# Patient Record
Sex: Female | Born: 1990 | Race: Black or African American | Hispanic: No | Marital: Married | Smoking: Never smoker
Health system: Southern US, Community
[De-identification: ages and names within clinical notes are randomized; demographics above are authoritative.]

## PROBLEM LIST (undated history)

## (undated) DIAGNOSIS — E119 Type 2 diabetes mellitus without complications: Secondary | ICD-10-CM

## (undated) DIAGNOSIS — J45909 Unspecified asthma, uncomplicated: Secondary | ICD-10-CM

## (undated) DIAGNOSIS — I10 Essential (primary) hypertension: Secondary | ICD-10-CM

## (undated) DIAGNOSIS — D649 Anemia, unspecified: Secondary | ICD-10-CM

---

## 2019-07-13 ENCOUNTER — Emergency Department (HOSPITAL_COMMUNITY): Payer: No Typology Code available for payment source

## 2019-07-13 ENCOUNTER — Emergency Department (HOSPITAL_COMMUNITY)
Admission: EM | Admit: 2019-07-13 | Discharge: 2019-07-13 | Disposition: A | Discharge status: Home / Self Care | Payer: No Typology Code available for payment source | Attending: Emergency Medicine | Admitting: Emergency Medicine

## 2019-07-13 ENCOUNTER — Other Ambulatory Visit: Payer: Self-pay

## 2019-07-13 DIAGNOSIS — R519 Headache, unspecified: Secondary | ICD-10-CM | POA: Diagnosis not present

## 2019-07-13 DIAGNOSIS — M545 Low back pain, unspecified: Secondary | ICD-10-CM

## 2019-07-13 DIAGNOSIS — G8921 Chronic pain due to trauma: Secondary | ICD-10-CM | POA: Insufficient documentation

## 2019-07-13 MED ORDER — DICLOFENAC SODIUM 75 MG PO TBEC: 75.0000 mg | DELAYED_RELEASE_TABLET | Freq: Two times a day (BID) | ORAL | 0 refills | Status: DC

## 2019-07-13 MED ORDER — METHOCARBAMOL 500 MG PO TABS: 500.0000 mg | ORAL_TABLET | Freq: Two times a day (BID) | ORAL | 0 refills | Status: DC

## 2019-07-13 MED ORDER — IBUPROFEN 800 MG PO TABS
800.0000 mg | ORAL_TABLET | Freq: Once | ORAL | Status: AC
  Administered 2019-07-13: 19:00:00 800 mg via ORAL
  Filled 2019-07-13: qty 1
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
## 2019-07-13 NOTE — ED Notes (Signed)
Patient verbalizes understanding of discharge instructions. Opportunity for questioning and answers were provided. Armband removed by staff, pt discharged from ED ambulatory.   
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
## 2019-07-13 NOTE — Discharge Instructions (Signed)
Return if any problems. See your Physician for recheck if pain persist  

## 2019-09-01 ENCOUNTER — Emergency Department (HOSPITAL_COMMUNITY)
Admission: EM | Admit: 2019-09-01 | Discharge: 2019-09-02 | Disposition: A | Discharge status: Home / Self Care | Payer: Medicaid Other | Attending: Emergency Medicine | Admitting: Emergency Medicine

## 2019-09-01 DIAGNOSIS — B9689 Other specified bacterial agents as the cause of diseases classified elsewhere: Secondary | ICD-10-CM | POA: Insufficient documentation

## 2019-09-01 DIAGNOSIS — R1030 Lower abdominal pain, unspecified: Secondary | ICD-10-CM

## 2019-09-01 DIAGNOSIS — N760 Acute vaginitis: Secondary | ICD-10-CM | POA: Diagnosis not present

## 2019-09-01 DIAGNOSIS — R10815 Periumbilic abdominal tenderness: Secondary | ICD-10-CM | POA: Diagnosis not present

## 2019-09-01 DIAGNOSIS — Z8709 Personal history of other diseases of the respiratory system: Secondary | ICD-10-CM | POA: Insufficient documentation

## 2019-09-01 DIAGNOSIS — M549 Dorsalgia, unspecified: Secondary | ICD-10-CM

## 2019-09-01 DIAGNOSIS — E119 Type 2 diabetes mellitus without complications: Secondary | ICD-10-CM | POA: Insufficient documentation

## 2019-09-01 DIAGNOSIS — R112 Nausea with vomiting, unspecified: Secondary | ICD-10-CM

## 2019-09-01 DIAGNOSIS — R10813 Right lower quadrant abdominal tenderness: Secondary | ICD-10-CM | POA: Diagnosis not present

## 2019-09-01 DIAGNOSIS — R10814 Left lower quadrant abdominal tenderness: Secondary | ICD-10-CM | POA: Insufficient documentation

## 2019-09-01 DIAGNOSIS — M545 Low back pain: Secondary | ICD-10-CM | POA: Diagnosis not present

## 2019-09-01 DIAGNOSIS — R102 Pelvic and perineal pain: Secondary | ICD-10-CM | POA: Diagnosis present

## 2019-09-01 DIAGNOSIS — I10 Essential (primary) hypertension: Secondary | ICD-10-CM | POA: Diagnosis not present

## 2019-09-01 HISTORY — DX: Essential (primary) hypertension: I10

## 2019-09-01 HISTORY — DX: Unspecified asthma, uncomplicated: J45.909

## 2019-09-01 HISTORY — DX: Type 2 diabetes mellitus without complications: E11.9

## 2019-09-01 HISTORY — DX: Anemia, unspecified: D64.9

## 2019-09-01 NOTE — ED Triage Notes (Signed)
Pt to ED with c/o lower abd pain and low back pain x's 5-6 days.  Also c/o frequent urination, nausea, vomiting and diarrhea.   Pt denies vag discharge

## 2019-09-02 ENCOUNTER — Emergency Department (HOSPITAL_COMMUNITY): Payer: Medicaid Other

## 2019-09-02 ENCOUNTER — Other Ambulatory Visit: Payer: Self-pay

## 2019-09-02 ENCOUNTER — Encounter (HOSPITAL_COMMUNITY): Payer: Self-pay | Admitting: Emergency Medicine

## 2019-09-02 LAB — COMPREHENSIVE METABOLIC PANEL
ALT: 19 U/L (ref 0–44)
AST: 16 U/L (ref 15–41)
Albumin: 3.2 g/dL — ABNORMAL LOW (ref 3.5–5.0)
Alkaline Phosphatase: 72 U/L (ref 38–126)
Anion gap: 10 (ref 5–15)
BUN: 7 mg/dL (ref 6–20)
CO2: 25 mmol/L (ref 22–32)
Calcium: 9.2 mg/dL (ref 8.9–10.3)
Chloride: 104 mmol/L (ref 98–111)
Creatinine, Ser: 0.83 mg/dL (ref 0.44–1.00)
GFR calc Af Amer: >60 mL/min (ref >60)
GFR calc non Af Amer: >60 mL/min (ref >60)
Glucose, Bld: 120 mg/dL — ABNORMAL HIGH (ref 70–99)
Potassium: 3.9 mmol/L (ref 3.5–5.1)
Sodium: 139 mmol/L (ref 135–145)
Total Bilirubin: 0.1 mg/dL — ABNORMAL LOW (ref 0.3–1.2)
Total Protein: 6.7 g/dL (ref 6.5–8.1)

## 2019-09-02 LAB — URINALYSIS, ROUTINE W REFLEX MICROSCOPIC
Bilirubin Urine: NEGATIVE
Glucose, UA: NEGATIVE mg/dL
Hgb urine dipstick: NEGATIVE
Ketones, ur: NEGATIVE mg/dL
Leukocytes,Ua: NEGATIVE
Nitrite: NEGATIVE
Protein, ur: NEGATIVE mg/dL
Specific Gravity, Urine: 1.017 (ref 1.005–1.030)
pH: 6 (ref 5.0–8.0)

## 2019-09-02 LAB — CBC
HCT: 37.7 % (ref 36.0–46.0)
Hemoglobin: 11.4 g/dL — ABNORMAL LOW (ref 12.0–15.0)
MCH: 22.8 pg — ABNORMAL LOW (ref 26.0–34.0)
MCHC: 30.2 g/dL (ref 30.0–36.0)
MCV: 75.2 fL — ABNORMAL LOW (ref 80.0–100.0)
Platelets: 322 10*3/uL (ref 150–400)
RBC: 5.01 MIL/uL (ref 3.87–5.11)
RDW: 14.8 % (ref 11.5–15.5)
WBC: 9.7 10*3/uL (ref 4.0–10.5)
nRBC: 0 % (ref 0.0–0.2)

## 2019-09-02 LAB — LIPASE, BLOOD: Lipase: 18 U/L (ref 11–51)

## 2019-09-02 LAB — WET PREP, GENITAL
Sperm: NONE SEEN
Trich, Wet Prep: NONE SEEN
Yeast Wet Prep HPF POC: NONE SEEN

## 2019-09-02 LAB — I-STAT BETA HCG BLOOD, ED (MC, WL, AP ONLY): I-stat hCG, quantitative: <5 m[IU]/mL (ref <5)

## 2019-09-02 LAB — HIV ANTIBODY (ROUTINE TESTING W REFLEX): HIV Screen 4th Generation wRfx: NONREACTIVE

## 2019-09-02 MED ORDER — SODIUM CHLORIDE 0.9% FLUSH: 3.0000 mL | Freq: Once | INTRAVENOUS | Status: DC

## 2019-09-02 MED ORDER — IOHEXOL 300 MG/ML  SOLN
100.0000 mL | Freq: Once | INTRAMUSCULAR | Status: AC | PRN
  Administered 2019-09-02: 100 mL via INTRAVENOUS

## 2019-09-02 MED ORDER — OXYCODONE-ACETAMINOPHEN 5-325 MG PO TABS
1.0000 | ORAL_TABLET | Freq: Once | ORAL | Status: AC
  Administered 2019-09-02: 1 via ORAL
  Filled 2019-09-02: qty 1

## 2019-09-02 MED ORDER — DICYCLOMINE HCL 20 MG PO TABS: 20.0000 mg | ORAL_TABLET | Freq: Two times a day (BID) | ORAL | 0 refills | Status: DC | PRN

## 2019-09-02 MED ORDER — ONDANSETRON HCL 4 MG/2ML IJ SOLN
4.0000 mg | Freq: Once | INTRAMUSCULAR | Status: AC
  Administered 2019-09-02: 4 mg via INTRAVENOUS
  Filled 2019-09-02: qty 2

## 2019-09-02 MED ORDER — MORPHINE SULFATE (PF) 4 MG/ML IV SOLN
4.0000 mg | Freq: Once | INTRAVENOUS | Status: AC
  Administered 2019-09-02: 11:00:00 4 mg via INTRAVENOUS
  Filled 2019-09-02: qty 1

## 2019-09-02 MED ORDER — PROMETHAZINE HCL 25 MG PO TABS: 25.0000 mg | ORAL_TABLET | Freq: Three times a day (TID) | ORAL | 0 refills | Status: DC | PRN

## 2019-09-02 MED ORDER — PROMETHAZINE HCL 25 MG/ML IJ SOLN
12.5000 mg | Freq: Once | INTRAMUSCULAR | Status: AC
  Administered 2019-09-02: 11:00:00 12.5 mg via INTRAVENOUS
  Filled 2019-09-02: qty 1

## 2019-09-02 MED ORDER — METRONIDAZOLE 500 MG PO TABS: 500.0000 mg | ORAL_TABLET | Freq: Two times a day (BID) | ORAL | 0 refills | Status: DC

## 2019-09-02 NOTE — ED Notes (Signed)
abd pain

## 2019-09-02 NOTE — Discharge Instructions (Signed)
Take Flagyl as prescribed.  Take the entire course.  Do not drink alcohol while taking this medicine Use Phenergan as needed for nausea or vomiting.  Have caution, this may make you tired or groggy. Use Bentyl as needed for abdominal pain or cramping. Use Tylenol and ibuprofen to help with pain. Use heating pads to help with pain. Follow-up with orthopedic doctor listed below as needed for further evaluation management of your back. Return to the emergency room if you develop high fevers, persistent vomiting despite medication, severe worsening pain, or any new, worsening, or concerning symptoms.

## 2019-09-02 NOTE — ED Provider Notes (Signed)
  Physical Exam  BP 112/60   Pulse 64   Temp 97.9 F (36.6 C) (Oral)   Resp 18   Ht 5\' 5"  (1.651 m)   Wt 113.4 kg   LMP 01/31/2019   SpO2 97%   BMI 41.60 kg/m   Physical Exam  Gen: appears nontoxic.  Abd: ttp of lower abd. No rigidity or distention. Negative rebound  ED Course/Procedures     Procedures  MDM   Pt signed out to me by Sharlene Motts, PA-C. Please see previous notes for further history.   In brief, pt presenting for evaluation of lower abd pain with associated nausea and intermittent vomiting. Pelvic exam reassuring. Labs reassuring. [place] pending. If negative, will likely need CT.   [place] negative. CT pending. Pt requesting something for nausea.  CT negative for acute findings. Shows bilateral SI degeneration. Pt states she has had 10 yrs chronic back pain, today's pain is different. I do not believe she needs emergent management for her back, but I recommend follow up with ortho, information given.  Discussed symptomatic tx at home. At this time, pt appears safe for d/c. return precautions given. Pt states she understands and agrees to plan.       Franchot Heidelberg, PA-C 09/02/19 1216    Pattricia Boss, MD 09/02/19 [phone]

## 2019-09-02 NOTE — ED Provider Notes (Signed)
[facility] EMERGENCY DEPARTMENT Provider Note   CSN: [id number] Arrival date & time: 09/01/19  2335     History   Chief Complaint Chief Complaint  Patient presents with  . Abdominal Pain    HPI Ashley Mccarty is a 28 y.o. female.     Patient to ED with c/o pain across her lower abdomen x 3 days associated with nausea and infrequent vomiting. No fever. She denies dysuria but reports urinary frequency. No vaginal discharge or vaginal bleeding. Last menses was May of this year after coming off oral contraceptives. She also complains of low back pain. No bowel movement changes. No history of similar symptoms.   The history is provided by the patient. No language interpreter was used.  Abdominal Pain Associated symptoms: nausea   Associated symptoms: no constipation, no diarrhea, no dysuria, no fever, no vaginal bleeding and no vaginal discharge     Past Medical History:  Diagnosis Date  . Anemia   . Asthma   . Diabetes mellitus without complication ([place])   . Hypertension     There are no active problems to display for this patient.   Past Surgical History:  Procedure Laterality Date  . CESAREAN SECTION       OB History   No obstetric history on file.      Home Medications    Prior to Admission medications   Medication Sig Start Date End Date Taking? Authorizing Provider  diclofenac (VOLTAREN) 75 MG EC tablet Take 1 tablet (75 mg total) by mouth 2 (two) times daily. 07/13/19   Fransico Meadow, PA-C  methocarbamol (ROBAXIN) 500 MG tablet Take 1 tablet (500 mg total) by mouth 2 (two) times daily. 07/13/19   Fransico Meadow, PA-C    Family History No family history on file.  Social History Social History   Tobacco Use  . Smoking status: Never Smoker  . Smokeless tobacco: Never Used  Substance Use Topics  . Alcohol use: Yes  . Drug use: Never     Allergies   Patient has no known allergies.   Review of Systems Review of Systems   Constitutional: Negative for fever.  Respiratory: Negative.   Cardiovascular: Negative.   Gastrointestinal: Positive for abdominal pain and nausea. Negative for blood in stool, constipation and diarrhea.  Genitourinary: Positive for frequency. Negative for dysuria, vaginal bleeding and vaginal discharge.  Musculoskeletal: Positive for back pain.     Physical Exam Updated Vital Signs BP 134/86   Pulse 69   Temp 97.9 F (36.6 C) (Oral)   Resp 18   Ht 5\' 5"  (1.651 m)   Wt 113.4 kg   LMP 01/31/2019   SpO2 100%   BMI 41.60 kg/m   Physical Exam Constitutional:      Appearance: She is well-developed.  Neck:     Musculoskeletal: Normal range of motion.  Pulmonary:     Effort: Pulmonary effort is normal.  Abdominal:     Tenderness: There is abdominal tenderness in the right lower quadrant, suprapubic area and left lower quadrant. There is no guarding.  Genitourinary:    Comments: External vaginal area normal in appearance. There is minimal white discharge present in the vagainl vault. Cervix is unremarkable in appearance. Minimally tender. There is left adnexal greater than right adnexal tenderness. Exam is limited by body habitus.  Skin:    General: Skin is warm and dry.  Neurological:     Mental Status: She is alert and oriented to person, place,  and time.      ED Treatments / Results  Labs (all labs ordered are listed, but only abnormal results are displayed) Labs Reviewed  COMPREHENSIVE METABOLIC PANEL - Abnormal; Notable for the following components:      Result Value   Glucose, Bld 120 (*)    Albumin 3.2 (*)    Total Bilirubin 0.1 (*)    All other components within normal limits  CBC - Abnormal; Notable for the following components:   Hemoglobin 11.4 (*)    MCV 75.2 (*)    MCH 22.8 (*)    All other components within normal limits  URINALYSIS, ROUTINE W REFLEX MICROSCOPIC - Abnormal; Notable for the following components:   APPearance HAZY (*)    All other  components within normal limits  LIPASE, BLOOD  I-STAT BETA HCG BLOOD, ED ([facility], [facility], [facility] ONLY)   Results for orders placed or performed during the hospital encounter of 09/01/19  Lipase, blood  Result Value Ref Range   Lipase 18 11 - 51 U/L  Comprehensive metabolic panel  Result Value Ref Range   Sodium 139 135 - 145 mmol/L   Potassium 3.9 3.5 - 5.1 mmol/L   Chloride 104 98 - 111 mmol/L   CO2 25 22 - 32 mmol/L   Glucose, Bld 120 (H) 70 - 99 mg/dL   BUN 7 6 - 20 mg/dL   Creatinine, Ser 4.090.83 0.44 - 1.00 mg/dL   Calcium 9.2 8.9 - 81.110.3 mg/dL   Total Protein 6.7 6.5 - 8.1 g/dL   Albumin 3.2 (L) 3.5 - 5.0 g/dL   AST 16 15 - 41 U/L   ALT 19 0 - 44 U/L   Alkaline Phosphatase 72 38 - 126 U/L   Total Bilirubin 0.1 (L) 0.3 - 1.2 mg/dL   GFR calc non Af Amer >60 >60 mL/min   GFR calc Af Amer >60 >60 mL/min   Anion gap 10 5 - 15  CBC  Result Value Ref Range   WBC 9.7 4.0 - 10.5 K/uL   RBC 5.01 3.87 - 5.11 MIL/uL   Hemoglobin 11.4 (L) 12.0 - 15.0 g/dL   HCT 91.437.7 78.236.0 - 95.646.0 %   MCV 75.2 (L) 80.0 - 100.0 fL   MCH 22.8 (L) 26.0 - 34.0 pg   MCHC 30.2 30.0 - 36.0 g/dL   RDW 21.314.8 08.611.5 - 57.815.5 %   Platelets 322 150 - 400 K/uL   nRBC 0.0 0.0 - 0.2 %  Urinalysis, Routine w reflex microscopic  Result Value Ref Range   Color, Urine YELLOW YELLOW   APPearance HAZY (A) CLEAR   Specific Gravity, Urine 1.017 1.005 - 1.030   pH 6.0 5.0 - 8.0   Glucose, UA NEGATIVE NEGATIVE mg/dL   Hgb urine dipstick NEGATIVE NEGATIVE   Bilirubin Urine NEGATIVE NEGATIVE   Ketones, ur NEGATIVE NEGATIVE mg/dL   Protein, ur NEGATIVE NEGATIVE mg/dL   Nitrite NEGATIVE NEGATIVE   Leukocytes,Ua NEGATIVE NEGATIVE  I-Stat beta hCG blood, ED  Result Value Ref Range   I-stat hCG, quantitative <5.0 <5 mIU/mL   Comment 3            EKG None  Radiology No results found.  Procedures Procedures (including critical care time)  Medications Ordered in ED Medications  sodium chloride flush (NS) 0.9 % injection 3 mL  (has no administration in time range)     Initial Impression / Assessment and Plan / ED Course  I have reviewed the triage vital signs and  the nursing notes.  Pertinent labs & imaging results that were available during my care of the patient were reviewed by me and considered in my medical decision making (see chart for details).        Patient to ED having pain across lower abdomen for the past 3 days. No fever, vag d/ch, vag bleeding, BM changes, dysuria. She reports nausea with infrequent vomiting.   On exam, there is L>R lower abdominal tenderness. She is more tender over the left adnexa as well. No vaginal discharge on pelvic exam.   There is no fever, no leukocytosis and no tenderness focal to the RLQ to suggest appy. No diarrhea or bloody stools to suggestion diverticulitis.   Pelvic US ordered for further evaluation. Of note she clarifies that her last Depo shot was in January, with some vaginal bleeding in May and none since. If [place] is negative, may consider CT scan based on tenderness on re-evaluation.   Patient care signed out to oncoming provider team pending imaging and patient re-eval.  Final Clinical Impressions(s) / ED Diagnoses   Final diagnoses:  None   1. Abdominal pain 2. Pelvic pain  ED Discharge Orders    None       Danne Harbor 09/02/19 0814    Marily Memos, MD 09/02/19 [phone]

## 2019-09-03 LAB — RPR: RPR Ser Ql: NONREACTIVE

## 2019-09-03 LAB — GC/CHLAMYDIA PROBE AMP (~~LOC~~) NOT AT ARMC
Chlamydia: NEGATIVE
Neisseria Gonorrhea: NEGATIVE

## 2019-10-19 ENCOUNTER — Other Ambulatory Visit: Payer: Self-pay | Admitting: Sports Medicine

## 2019-10-19 DIAGNOSIS — M545 Low back pain, unspecified: Secondary | ICD-10-CM

## 2019-11-11 ENCOUNTER — Ambulatory Visit
Admission: RE | Admit: 2019-11-11 | Discharge: 2019-11-11 | Disposition: A | Discharge status: Home / Self Care | Payer: Medicaid Other | Source: Ambulatory Visit | Attending: Sports Medicine | Admitting: Sports Medicine

## 2019-11-11 DIAGNOSIS — M545 Low back pain, unspecified: Secondary | ICD-10-CM

## 2020-02-07 ENCOUNTER — Other Ambulatory Visit: Payer: Self-pay

## 2020-02-07 ENCOUNTER — Ambulatory Visit (HOSPITAL_COMMUNITY)
Admission: EM | Admit: 2020-02-07 | Discharge: 2020-02-07 | Disposition: A | Discharge status: Home / Self Care | Payer: Medicaid Other | Attending: Family Medicine | Admitting: Family Medicine

## 2020-02-07 ENCOUNTER — Encounter (HOSPITAL_COMMUNITY): Payer: Self-pay

## 2020-02-07 DIAGNOSIS — M779 Enthesopathy, unspecified: Secondary | ICD-10-CM | POA: Diagnosis not present

## 2020-02-07 LAB — POC URINE PREG, ED: Preg Test, Ur: NEGATIVE

## 2020-02-07 MED ORDER — PREDNISONE 10 MG (21) PO TBPK: ORAL_TABLET | ORAL | 0 refills | Status: DC

## 2020-02-07 NOTE — ED Provider Notes (Signed)
[facility]    CSN: [id number] Arrival date & time: 02/07/20  0626      History   Chief Complaint Chief Complaint  Patient presents with  . Arm Pain    HPI Ashley Mccarty is a 29 y.o. female.   Patient is a 29 year old female who presents today with right wrist pain, swelling for the past couple days.  Symptoms have been constant.  She has been wrapping the hand/wrist and placing ice with out much relief.  She is also been taking 600 ibuprofen without much relief.  Does repetitive movements with hands and wrist at her job.  She is a Investment banker, operational.  Denies any injuries.  Mild tingling at times with certain movements.  No fever.  ROS per HPI      Past Medical History:  Diagnosis Date  . Anemia   . Asthma   . Diabetes mellitus without complication ([facility])   . Hypertension     There are no problems to display for this patient.   Past Surgical History:  Procedure Laterality Date  . CESAREAN SECTION      OB History   No obstetric history on file.      Home Medications    Prior to Admission medications   Medication Sig Start Date End Date Taking? Authorizing Provider  diclofenac (VOLTAREN) 75 MG EC tablet Take 1 tablet (75 mg total) by mouth 2 (two) times daily. 07/13/19   Elson Areas, PA-C  dicyclomine (BENTYL) 20 MG tablet Take 1 tablet (20 mg total) by mouth 2 (two) times daily as needed for spasms. 09/02/19   Caccavale, Sophia, PA-C  methocarbamol (ROBAXIN) 500 MG tablet Take 1 tablet (500 mg total) by mouth 2 (two) times daily. 07/13/19   Elson Areas, PA-C  metroNIDAZOLE (FLAGYL) 500 MG tablet Take 1 tablet (500 mg total) by mouth 2 (two) times daily. 09/02/19   Caccavale, Sophia, PA-C  predniSONE (STERAPRED UNI-PAK 21 TAB) 10 MG (21) TBPK tablet 6 tabs for 1 day, then 5 tabs for 1 das, then 4 tabs for 1 day, then 3 tabs for 1 day, 2 tabs for 1 day, then 1 tab for 1 day 02/07/20   Dahlia Byes A, NP  promethazine (PHENERGAN) 25 MG tablet Take 1 tablet (25  mg total) by mouth every 8 (eight) hours as needed for nausea or vomiting. 09/02/19   Caccavale, Sophia, PA-C    Family History Family History  Problem Relation Age of Onset  . Diabetes Mother   . Hyperlipidemia Mother   . Hypertension Mother     Social History Social History   Tobacco Use  . Smoking status: Never Smoker  . Smokeless tobacco: Never Used  Substance Use Topics  . Alcohol use: Yes  . Drug use: Never     Allergies   Latex and Toradol [ketorolac tromethamine]   Review of Systems Review of Systems   Physical Exam Triage Vital Signs ED Triage Vitals  Enc Vitals Group     BP 02/07/20 1031 118/83     Pulse Rate 02/07/20 1031 64     Resp 02/07/20 1031 18     Temp 02/07/20 1031 98.8 F (37.1 C)     Temp Source 02/07/20 1031 Oral     SpO2 02/07/20 1031 100 %     Weight --      Height --      Head Circumference --      Peak Flow --      Pain  Score 02/07/20 1029 10     Pain Loc --      Pain Edu? --      Excl. in [place]? --    No data found.  Updated Vital Signs BP 118/83 (BP Location: Left Arm)   Pulse 64   Temp 98.8 F (37.1 C) (Oral)   Resp 18   LMP 01/22/2020   SpO2 100%   Visual Acuity Right Eye Distance:   Left Eye Distance:   Bilateral Distance:    Right Eye Near:   Left Eye Near:    Bilateral Near:     Physical Exam Vitals and nursing note reviewed.  Constitutional:      General: She is not in acute distress.    Appearance: Normal appearance. She is not ill-appearing, toxic-appearing or diaphoretic.  HENT:     Head: Normocephalic.     Nose: Nose normal.  Eyes:     Conjunctiva/sclera: Conjunctivae normal.  Pulmonary:     Effort: Pulmonary effort is normal.  Musculoskeletal:        General: Normal range of motion.     Right wrist: Swelling, tenderness and bony tenderness present.     Cervical back: Normal range of motion.     Comments: Positive Finkelstein  Skin:    General: Skin is warm and dry.     Findings: No rash.    Neurological:     Mental Status: She is alert.  Psychiatric:        Mood and Affect: Mood normal.      UC Treatments / Results  Labs (all labs ordered are listed, but only abnormal results are displayed) Labs Reviewed - No data to display  EKG   Radiology No results found.  Procedures Procedures (including critical care time)  Medications Ordered in UC Medications - No data to display  Initial Impression / Assessment and Plan / UC Course  I have reviewed the triage vital signs and the nursing notes.  Pertinent labs & imaging results that were available during my care of the patient were reviewed by me and considered in my medical decision making (see chart for details).     Tendinitis Exam and symptoms consistent with this.  We will give her a thumb spica splint to wear for comfort and have her wear this until feeling better. Rest, ice, elevate and prednisone taper over the next 6 days for pain, inflammation and swelling. Follow up as needed for continued or worsening symptoms  Final Clinical Impressions(s) / UC Diagnoses   Final diagnoses:  Tendonitis     Discharge Instructions     Treating for tendinitis Splint given here to wear until feeling better. Prednisone taper over the next 6 days for pain, inflammation and swelling. Rest, ice Follow up as needed for continued or worsening symptoms     ED Prescriptions    Medication Sig Dispense Auth. Provider   predniSONE (STERAPRED UNI-PAK 21 TAB) 10 MG (21) TBPK tablet 6 tabs for 1 day, then 5 tabs for 1 das, then 4 tabs for 1 day, then 3 tabs for 1 day, 2 tabs for 1 day, then 1 tab for 1 day 21 tablet Dequavious Harshberger A, NP     PDMP not reviewed this encounter.   Loura Halt A, NP 02/07/20 1108

## 2020-02-07 NOTE — Discharge Instructions (Addendum)
Treating for tendinitis Splint given here to wear until feeling better. Prednisone taper over the next 6 days for pain, inflammation and swelling. Rest, ice Follow up as needed for continued or worsening symptoms

## 2020-02-07 NOTE — ED Triage Notes (Signed)
Pt c/o acute onset right arm/hand pain/swelling since Saturday. Denies any known injury/trauma. Pt tender to touch at anterior hand/thumb/wrist area; edema noted.

## 2020-03-20 ENCOUNTER — Ambulatory Visit (HOSPITAL_COMMUNITY)
Admission: RE | Admit: 2020-03-20 | Discharge: 2020-03-20 | Disposition: A | Discharge status: Home / Self Care | Payer: Medicaid Other | Source: Ambulatory Visit | Attending: Family Medicine | Admitting: Family Medicine

## 2020-03-20 ENCOUNTER — Encounter (HOSPITAL_COMMUNITY): Payer: Self-pay

## 2020-03-20 ENCOUNTER — Other Ambulatory Visit: Payer: Self-pay

## 2020-03-20 VITALS — BP 127/77 | HR 80 | Temp 98.5°F | Resp 16

## 2020-03-20 DIAGNOSIS — R1010 Upper abdominal pain, unspecified: Secondary | ICD-10-CM | POA: Diagnosis not present

## 2020-03-20 DIAGNOSIS — R112 Nausea with vomiting, unspecified: Secondary | ICD-10-CM | POA: Diagnosis not present

## 2020-03-20 DIAGNOSIS — R110 Nausea: Secondary | ICD-10-CM | POA: Diagnosis not present

## 2020-03-20 DIAGNOSIS — R197 Diarrhea, unspecified: Secondary | ICD-10-CM | POA: Diagnosis not present

## 2020-03-20 DIAGNOSIS — Z1152 Encounter for screening for COVID-19: Secondary | ICD-10-CM | POA: Diagnosis present

## 2020-03-20 DIAGNOSIS — R5383 Other fatigue: Secondary | ICD-10-CM

## 2020-03-20 DIAGNOSIS — R109 Unspecified abdominal pain: Secondary | ICD-10-CM

## 2020-03-20 LAB — POCT URINALYSIS DIP (DEVICE)
Bilirubin Urine: NEGATIVE
Glucose, UA: NEGATIVE mg/dL
Hgb urine dipstick: NEGATIVE
Leukocytes,Ua: NEGATIVE
Nitrite: NEGATIVE
Protein, ur: NEGATIVE mg/dL
Specific Gravity, Urine: >=1.03 (ref 1.005–1.030)
Urobilinogen, UA: 1 mg/dL (ref 0.0–1.0)
pH: 6 (ref 5.0–8.0)

## 2020-03-20 MED ORDER — DICYCLOMINE HCL 20 MG PO TABS
20.0000 mg | ORAL_TABLET | Freq: Two times a day (BID) | ORAL | 0 refills | Status: DC
Start: 2020-03-20 — End: 2020-03-23

## 2020-03-20 MED ORDER — ONDANSETRON 4 MG PO TBDP
ORAL_TABLET | ORAL | Status: AC
  Filled 2020-03-20: qty 1

## 2020-03-20 MED ORDER — ONDANSETRON HCL 4 MG PO TABS
4.0000 mg | ORAL_TABLET | Freq: Four times a day (QID) | ORAL | 0 refills | Status: DC
Start: 2020-03-20 — End: 2021-01-03

## 2020-03-20 MED ORDER — ONDANSETRON 4 MG PO TBDP
4.0000 mg | ORAL_TABLET | Freq: Once | ORAL | Status: AC
  Administered 2020-03-20: 4 mg via ORAL

## 2020-03-20 NOTE — ED Provider Notes (Addendum)
[facility]   [id number] 03/20/20 Arrival Time: 1912  CC: ABDOMINAL PAIN  SUBJECTIVE:  Ashley Mccarty is a 29 y.o. female who presents with complaint of generalized abdominal discomfort, nausea, diarrhea and chills that began gradually about 4 days ago. Denies a precipitating event, trauma, close contacts with similar symptoms, recent travel or antibiotic use. Describes as intermittent and cramping in character. Has tried OTC medications without relief. Denies alleviating or aggravating factors.  Denies similar symptoms in the past. Last BM today.    Denies fever, chills, appetite changes, weight changes, chest pain, SOB, hematochezia, melena, dysuria, difficulty urinating, increased frequency or urgency, flank pain, loss of bowel or bladder function, vaginal discharge, vaginal odor, vaginal bleeding, dyspareunia, pelvic pain.     Patient's last menstrual period was 02/15/2020 (exact date).  ROS: As per HPI.  All other pertinent ROS negative.     Past Medical History:  Diagnosis Date   Anemia    Asthma    Diabetes mellitus without complication (HCC)    Hypertension    Past Surgical History:  Procedure Laterality Date   CESAREAN SECTION     Allergies  Allergen Reactions   Latex Shortness Of Breath   Toradol [Ketorolac Tromethamine] Shortness Of Breath   No current facility-administered medications on file prior to encounter.   Current Outpatient Medications on File Prior to Encounter  Medication Sig Dispense Refill   acetaminophen (TYLENOL) 500 MG tablet Take 500 mg by mouth every 6 (six) hours as needed.     bismuth subsalicylate (PEPTO BISMOL) 262 MG/15ML suspension Take 30 mLs by mouth every 6 (six) hours as needed.     ibuprofen (ADVIL) 200 MG tablet Take 200 mg by mouth every 6 (six) hours as needed.     diclofenac (VOLTAREN) 75 MG EC tablet Take 1 tablet (75 mg total) by mouth 2 (two) times daily. 20 tablet 0   methocarbamol (ROBAXIN) 500 MG tablet  Take 1 tablet (500 mg total) by mouth 2 (two) times daily. 20 tablet 0   [DISCONTINUED] promethazine (PHENERGAN) 25 MG tablet Take 1 tablet (25 mg total) by mouth every 8 (eight) hours as needed for nausea or vomiting. 10 tablet 0   Social History   Socioeconomic History   Marital status: Married    Spouse name: Not on file   Number of children: Not on file   Years of education: Not on file   Highest education level: Not on file  Occupational History   Not on file  Tobacco Use   Smoking status: Never Smoker   Smokeless tobacco: Never Used  Substance and Sexual Activity   Alcohol use: Yes   Drug use: Never   Sexual activity: Not on file  Other Topics Concern   Not on file  Social History Narrative   Not on file   Social Determinants of Health   Financial Resource Strain:    Difficulty of Paying Living Expenses:   Food Insecurity:    Worried About Running Out of Food in the Last Year:    Barista in the Last Year:   Transportation Needs:    Freight forwarder (Medical):    Lack of Transportation (Non-Medical):   Physical Activity:    Days of Exercise per Week:    Minutes of Exercise per Session:   Stress:    Feeling of Stress :   Social Connections:    Frequency of Communication with Friends and Family:    Frequency of Social Gatherings  with Friends and Family:    Attends Religious Services:    Active Member of Clubs or Organizations:    Attends Music therapist:    Marital Status:   Intimate Partner Violence:    Fear of Current or Ex-Partner:    Emotionally Abused:    Physically Abused:    Sexually Abused:    Family History  Problem Relation Age of Onset   Diabetes Mother    Hyperlipidemia Mother    Hypertension Mother      OBJECTIVE:  Vitals:   03/20/20 2024  BP: 127/77  Pulse: 80  Resp: 16  Temp: 98.5 F (36.9 C)  TempSrc: Oral  SpO2: 100%    General appearance: Alert; NAD HEENT: NCAT.  Oropharynx clear.  Lungs: clear to auscultation bilaterally without adventitious breath sounds Heart: regular rate and rhythm. Radial pulses 2+ symmetrical bilaterally Abdomen: soft, non-distended; normal active bowel sounds; non-tender to light and deep palpation; nontender at McBurney's point; negative Murphy's sign; negative rebound; no guarding Back: no CVA tenderness Extremities: no edema; symmetrical with no gross deformities Skin: warm and dry Neurologic: normal gait Psychological: alert and cooperative; normal mood and affect  LABS: Results for orders placed or performed during the hospital encounter of 03/20/20 (from the past 24 hour(s))  SARS CORONAVIRUS 2 (TAT 6-24 HRS) Nasopharyngeal Nasopharyngeal Swab     Status: None   Collection Time: 03/21/20  4:00 PM   Specimen: Nasopharyngeal Swab  Result Value Ref Range   SARS Coronavirus 2 NEGATIVE NEGATIVE    DIAGNOSTIC STUDIES: No results found.   ASSESSMENT & PLAN:  1. Diarrhea, unspecified type   2. Nausea and vomiting, intractability of vomiting not specified, unspecified vomiting type   3. Pain of upper abdomen   4. Other fatigue   5. Encounter for screening for COVID-19     Meds ordered this encounter  Medications   ondansetron (ZOFRAN-ODT) disintegrating tablet 4 mg   ondansetron (ZOFRAN) 4 MG tablet    Sig: Take 1 tablet (4 mg total) by mouth every 6 (six) hours.    Dispense:  12 tablet    Refill:  0    Order Specific Question:   Supervising Provider    Answer:   Chase Picket [[id number]]   dicyclomine (BENTYL) 20 MG tablet    Sig: Take 1 tablet (20 mg total) by mouth 2 (two) times daily.    Dispense:  20 tablet    Refill:  0    Order Specific Question:   Supervising Provider    Answer:   Chase Picket [[id number]]    Diarrhea Nausea Abdominal Pain Fatigue Covid Screen  Zofran given in office UA negative for infection Get rest and drink fluids Zofran prescribed. Take as directed.    DIET  Instructions:  30 minutes after taking nausea medicine, begin with sips of clear liquids. If able to hold down 2 - 4 ounces for 30 minutes, begin drinking more. Increase your fluid intake to replace losses. Clear liquids only for 24 hours (water, tea, sport drinks, clear flat ginger ale or cola and juices, broth, jello, popsicles, ect). Advance to bland foods, applesauce, rice, baked or boiled chicken, ect. Avoid milk, greasy foods and anything that doesnt agree with you.  If you experience new or worsening symptoms return or go to ER such as fever, chills, nausea, vomiting, diarrhea, bloody or dark tarry stools, constipation, urinary symptoms, worsening abdominal discomfort, symptoms that do not improve with medications, inability to keep fluids down.  Reviewed expectations re: course of current medical issues. Questions answered. Outlined signs and symptoms indicating need for more acute intervention. Patient verbalized understanding. After Visit Summary given.    Moshe Cipro, NP 03/22/20 1452    Moshe Cipro, NP 03/26/20 1116

## 2020-03-20 NOTE — Discharge Instructions (Signed)
You are experiencing gastroenteritis  I have sent in some Zofran for nausea and dicyclomine for abdominal pain  UA negative for infection  Your COVID test is pending.  You should self quarantine until the test result is back.    Take Tylenol as needed for fever or discomfort.  Rest and keep yourself hydrated.    Go to the emergency department if you develop shortness of breath, severe diarrhea, high fever not relieved by Tylenol or ibuprofen, or other concerning symptoms.

## 2020-03-20 NOTE — ED Triage Notes (Signed)
Pt presents to UC with abdominal pain, diarrhea, nausea and chills x 4 days. Tylenol, Pepto Bismol and ibuprofen do not give relief.

## 2020-03-21 LAB — SARS CORONAVIRUS 2 (TAT 6-24 HRS): SARS Coronavirus 2: NEGATIVE

## 2020-03-23 ENCOUNTER — Ambulatory Visit (HOSPITAL_COMMUNITY)
Admission: EM | Admit: 2020-03-23 | Discharge: 2020-03-23 | Disposition: A | Discharge status: Home / Self Care | Payer: Medicaid Other | Attending: Internal Medicine | Admitting: Internal Medicine

## 2020-03-23 ENCOUNTER — Other Ambulatory Visit: Payer: Self-pay

## 2020-03-23 ENCOUNTER — Encounter (HOSPITAL_COMMUNITY): Payer: Self-pay | Admitting: Emergency Medicine

## 2020-03-23 ENCOUNTER — Ambulatory Visit (HOSPITAL_COMMUNITY): Payer: Self-pay

## 2020-03-23 ENCOUNTER — Ambulatory Visit (INDEPENDENT_AMBULATORY_CARE_PROVIDER_SITE_OTHER): Payer: Medicaid Other

## 2020-03-23 DIAGNOSIS — R1084 Generalized abdominal pain: Secondary | ICD-10-CM | POA: Diagnosis not present

## 2020-03-23 DIAGNOSIS — K5900 Constipation, unspecified: Secondary | ICD-10-CM | POA: Diagnosis not present

## 2020-03-23 DIAGNOSIS — Z3202 Encounter for pregnancy test, result negative: Secondary | ICD-10-CM

## 2020-03-23 DIAGNOSIS — R109 Unspecified abdominal pain: Secondary | ICD-10-CM | POA: Diagnosis not present

## 2020-03-23 LAB — POCT URINALYSIS DIP (DEVICE)
Bilirubin Urine: NEGATIVE
Glucose, UA: NEGATIVE mg/dL
Ketones, ur: NEGATIVE mg/dL
Nitrite: NEGATIVE
Protein, ur: NEGATIVE mg/dL
Specific Gravity, Urine: >=1.03 (ref 1.005–1.030)
Urobilinogen, UA: 0.2 mg/dL (ref 0.0–1.0)
pH: 7 (ref 5.0–8.0)

## 2020-03-23 LAB — POC URINE PREG, ED: Preg Test, Ur: NEGATIVE

## 2020-03-23 MED ORDER — POLYETHYLENE GLYCOL 3350 17 G PO PACK: 17.0000 g | PACK | Freq: Every day | ORAL | 0 refills | Status: DC

## 2020-03-23 NOTE — ED Provider Notes (Signed)
[facility]    CSN: [id number] Arrival date & time: 03/23/20  3016      History   Chief Complaint Chief Complaint  Patient presents with  . Appointment  . Abdominal Pain    HPI Ashley Mccarty is a 29 y.o. female.   Patient is a 29 year old female presents today with generalized waxing and waning abdominal pain described as sharp at times and dull aching others.  The pain tends to move around her stomach from the lower abdomen on both sides and to the back area.  Was seen here 4 days ago and treated for viral G GI symptoms.  Has been taking Bentyl and Zofran with some relief.  Reporting she has been having hard stools and having a hard time having a bowel movement.  No blood in stool.  No diarrhea at this time.  This has resolved.  She has had some vaginal spotting and is due for her menstrual cycle.  No fever, chills, vomiting.  Has been producing gas.  No dysuria, hematuria or urinary frequency.  No vaginal symptoms.  ROS per HPI      Past Medical History:  Diagnosis Date  . Anemia   . Asthma   . Diabetes mellitus without complication (HCC)   . Hypertension     There are no problems to display for this patient.   Past Surgical History:  Procedure Laterality Date  . CESAREAN SECTION      OB History   No obstetric history on file.      Home Medications    Prior to Admission medications   Medication Sig Start Date End Date Taking? Authorizing Provider  acetaminophen (TYLENOL) 500 MG tablet Take 500 mg by mouth every 6 (six) hours as needed.    [provider]  bismuth subsalicylate (PEPTO BISMOL) 262 MG/15ML suspension Take 30 mLs by mouth every 6 (six) hours as needed.    [provider]  ibuprofen (ADVIL) 200 MG tablet Take 200 mg by mouth every 6 (six) hours as needed.    [provider]  ondansetron (ZOFRAN) 4 MG tablet Take 1 tablet (4 mg total) by mouth every 6 (six) hours. 03/20/20   Moshe Cipro, NP    polyethylene glycol (MIRALAX / GLYCOLAX) 17 g packet Take 17 g by mouth daily. 03/23/20   Dahlia Byes A, NP  dicyclomine (BENTYL) 20 MG tablet Take 1 tablet (20 mg total) by mouth 2 (two) times daily. 03/20/20 03/23/20  Moshe Cipro, NP  promethazine (PHENERGAN) 25 MG tablet Take 1 tablet (25 mg total) by mouth every 8 (eight) hours as needed for nausea or vomiting. 09/02/19 03/20/20  C[place], Sophia, PA-C    Family History Family History  Problem Relation Age of Onset  . Diabetes Mother   . Hyperlipidemia Mother   . Hypertension Mother     Social History Social History   Tobacco Use  . Smoking status: Never Smoker  . Smokeless tobacco: Never Used  Substance Use Topics  . Alcohol use: Yes  . Drug use: Never     Allergies   Latex and Toradol [ketorolac tromethamine]   Review of Systems Review of Systems   Physical Exam Triage Vital Signs ED Triage Vitals  Enc Vitals Group     BP 03/23/20 1009 112/76     Pulse Rate 03/23/20 1009 60     Resp 03/23/20 1009 18     Temp 03/23/20 1009 98.7 F (37.1 C)     Temp Source 03/23/20  1009 Oral     SpO2 03/23/20 1009 100 %     Weight --      Height --      Head Circumference --      Peak Flow --      Pain Score 03/23/20 1007 10     Pain Loc --      Pain Edu? --      Excl. in GC? --    No data found.  Updated Vital Signs BP 112/76 (BP Location: Left Arm)   Pulse 60   Temp 98.7 F (37.1 C) (Oral)   Resp 18   LMP 02/15/2020 Comment: denies pregnancy  SpO2 100%   Visual Acuity Right Eye Distance:   Left Eye Distance:   Bilateral Distance:    Right Eye Near:   Left Eye Near:    Bilateral Near:     Physical Exam Vitals and nursing note reviewed.  Constitutional:      General: She is not in acute distress.    Appearance: Normal appearance. She is not ill-appearing, toxic-appearing or diaphoretic.  HENT:     Head: Normocephalic.     Nose: Nose normal.  Eyes:     Conjunctiva/sclera: Conjunctivae normal.   Pulmonary:     Effort: Pulmonary effort is normal.  Abdominal:     Palpations: Abdomen is soft.     Tenderness: There is generalized abdominal tenderness.  Musculoskeletal:        General: Normal range of motion.     Cervical back: Normal range of motion.  Skin:    General: Skin is warm and dry.     Findings: No rash.  Neurological:     Mental Status: She is alert.  Psychiatric:        Mood and Affect: Mood normal.      UC Treatments / Results  Labs (all labs ordered are listed, but only abnormal results are displayed) Labs Reviewed  POCT URINALYSIS DIP (DEVICE) - Abnormal; Notable for the following components:      Result Value   Hgb urine dipstick SMALL (*)    Leukocytes,Ua SMALL (*)    All other components within normal limits  URINE CULTURE  POC URINE PREG, ED    EKG   Radiology DG Abd 2 Views  Result Date: 03/23/2020 CLINICAL DATA:  Abdominal pain.  Constipation. EXAM: ABDOMEN - 2 VIEW COMPARISON:  CT 09/02/2019. FINDINGS: Soft tissue structures are unremarkable. No bowel distention. Moderate stool noted throughout the colon. No free air. Mild lumbar spine scoliosis concave right. No acute bony abnormality. IMPRESSION: No acute abnormality identified.  Moderate stool volume. Electronically Signed   By: Maisie Fus  Register   On: 03/23/2020 11:24    Procedures Procedures (including critical care time)  Medications Ordered in UC Medications - No data to display  Initial Impression / Assessment and Plan / UC Course  I have reviewed the triage vital signs and the nursing notes.  Pertinent labs & imaging results that were available during my care of the patient were reviewed by me and considered in my medical decision making (see chart for details).     Abdominal pain Most likely to constipation.  No acute abdomen today. Vital signs are stable and she is nontoxic or ill-appearing.  Will treat with MiraLAX.  Moderate stool noted throughout colon on abdominal  x-ray. No bowel obstruction. Urine with mild leuks and small blood.  Will send for culture. Recommended stop Bentyl and limit Zofran due to side effects  Drink plenty of water and for worsening symptoms go to the ER. Final Clinical Impressions(s) / UC Diagnoses   Final diagnoses:  Generalized abdominal pain  Constipation, unspecified constipation type     Discharge Instructions     Moderate amount of stool in your colon.  I recommend doing MiraLAX daily for the next few days for good bowel movement. If your abdominal pain worsens you will need to go to the ER.  Recommend trying to limit the Zofran due to causing constipation. You can stop taking the Bentyl Follow up as needed for continued or worsening symptoms     ED Prescriptions    Medication Sig Dispense Auth. Provider   polyethylene glycol (MIRALAX / GLYCOLAX) 17 g packet Take 17 g by mouth daily. 14 each Orvan July, NP     PDMP not reviewed this encounter.   Orvan July, NP 03/23/20 1327

## 2020-03-23 NOTE — ED Triage Notes (Addendum)
Pt c/o lower abd pain and back pain. Pt states her symptoms from last time are controlled by medication. Pt states pain feels like a sharp stabbing pain. Pt states she has the urge to defecate but she has to wait and sit on the commode a while to produce a stool. Pt states she has not had anymore diarrhea. Pt states she had some vaginal bleeding and spotting and her period is a few days late.

## 2020-03-23 NOTE — Discharge Instructions (Addendum)
Moderate amount of stool in your colon.  I recommend doing MiraLAX daily for the next few days for good bowel movement. If your abdominal pain worsens you will need to go to the ER.  Recommend trying to limit the Zofran due to causing constipation. You can stop taking the Bentyl Follow up as needed for continued or worsening symptoms

## 2020-03-24 LAB — URINE CULTURE

## 2020-08-17 ENCOUNTER — Emergency Department (HOSPITAL_COMMUNITY)
Admission: EM | Admit: 2020-08-17 | Discharge: 2020-08-17 | Disposition: A | Discharge status: Home / Self Care | Payer: Medicaid Other | Attending: Emergency Medicine | Admitting: Emergency Medicine

## 2020-08-17 ENCOUNTER — Other Ambulatory Visit: Payer: Self-pay

## 2020-08-17 DIAGNOSIS — I10 Essential (primary) hypertension: Secondary | ICD-10-CM | POA: Diagnosis not present

## 2020-08-17 DIAGNOSIS — J45909 Unspecified asthma, uncomplicated: Secondary | ICD-10-CM | POA: Insufficient documentation

## 2020-08-17 DIAGNOSIS — N939 Abnormal uterine and vaginal bleeding, unspecified: Secondary | ICD-10-CM | POA: Diagnosis not present

## 2020-08-17 DIAGNOSIS — E119 Type 2 diabetes mellitus without complications: Secondary | ICD-10-CM | POA: Insufficient documentation

## 2020-08-17 LAB — I-STAT BETA HCG BLOOD, ED (MC, WL, AP ONLY): I-stat hCG, quantitative: <5 m[IU]/mL (ref <5)

## 2020-08-17 NOTE — ED Notes (Signed)
Patient verbalizes understanding of discharge instructions. Opportunity for questioning and answers were provided. Armband removed by staff, pt discharged from ED.  

## 2020-08-17 NOTE — Discharge Instructions (Addendum)
You were evaluated in the Emergency Department and after careful evaluation, we did not find any emergent condition requiring admission or further testing in the hospital.  Your exam/testing today is overall reassuring.  We recommend follow-up with a GYN doctor to discuss your abnormal uterine bleeding.  Please return to the Emergency Department if you experience any worsening of your condition.   Thank you for allowing [place] to be a part of your care.

## 2020-08-17 NOTE — ED Triage Notes (Signed)
Pt endorses vaginal bleeding for 3 days. States it is a miscarriage and wants to make sure everything is out.

## 2020-08-17 NOTE — ED Triage Notes (Signed)
Emergency Medicine Provider OB Triage Evaluation Note  Ashley Mccarty is a 29 y.o. female, No obstetric history on file., at Unknown gestation who presents to the emergency department with complaints of heavy vaginal bleeding that began 3 days ago. Pt reports that she has also had some lower abdominal pain, nausea, and vomiting. She reports LNMP 2 months ago which is irregular for her - she has taken multiple pregnancy tests at home which have all returned "invalid." Pt called her PCP's office today and was told to come to the ED as it sounded like pt was having a "miscarriage." Pt has not had a positive pregnancy test to confirm that she is truly pregnant. She reports going through 7 large overnight pads on day 1, 6 pads on day 2, and 2 today. She has been passing large blood clots as well.   Review of  Systems  Positive: + vaginal bleeding, + abdominal pain, + nausea, + vomiting Negative: - fever, - chills  Physical Exam  BP 116/76   Pulse 87   Temp 98.7 F (37.1 C) (Oral)   Resp 16   Ht 5\' 5"  (1.651 m)   Wt 113.4 kg   SpO2 100%   BMI 41.60 kg/m  General: Awake, no distress  HEENT: Atraumatic  Resp: Normal effort  Cardiac: Normal rate Abd: Nondistended, + diffuse TTP MSK: Moves all extremities without difficulty Neuro: Speech clear  Medical Decision Making  Pt here for vaginal bleeding with concern for "miscarriage" however she does not know if she is pregnant as she has not had a positive pregnancy test at home or at a medical facility. Will await pregnancy test at this time prior to speaking with [facility] provider. Triage made aware.   Pregnancy test has returned negative. Pt will be worked up in the ED for vaginal bleeding.  Clinical Impression  No diagnosis found.     [facility], PA-C 08/17/20 1625

## 2020-08-17 NOTE — ED Provider Notes (Signed)
[facility] Emergency Department Provider Note MRN:  [id number]  Arrival date & time: 08/17/20     Chief Complaint   Vaginal Bleeding   History of Present Illness   Ashley Mccarty is a 29 y.o. year-old female with a history of diabetes, hypertension presenting to the ED with chief complaint of vaginal bleeding.  Heavy vaginal bleeding for the past 3 to 4 days associated with lower pelvic cramping pain.  Described as sharp, intermittent.  This is her first vaginal bleeding for the past 2 months, she was not sure if she was pregnant.  Sent here by her PCP for evaluation.  Denies lightheadedness or fainting spells, no headache or vision change, no chest pain or shortness of breath, no upper abdominal pain, no vaginal pain or discharge.  Review of Systems  A complete 10 system review of systems was obtained and all systems are negative except as noted in the HPI and PMH.   Patient's Health History    Past Medical History:  Diagnosis Date  . Anemia   . Asthma   . Diabetes mellitus without complication (HCC)   . Hypertension     Past Surgical History:  Procedure Laterality Date  . CESAREAN SECTION      Family History  Problem Relation Age of Onset  . Diabetes Mother   . Hyperlipidemia Mother   . Hypertension Mother     Social History   Socioeconomic History  . Marital status: Married    Spouse name: Not on file  . Number of children: Not on file  . Years of education: Not on file  . Highest education level: Not on file  Occupational History  . Not on file  Tobacco Use  . Smoking status: Never Smoker  . Smokeless tobacco: Never Used  Substance and Sexual Activity  . Alcohol use: Yes  . Drug use: Never  . Sexual activity: Not on file  Other Topics Concern  . Not on file  Social History Narrative  . Not on file   Social Determinants of Health   Financial Resource Strain:   . Difficulty of Paying Living Expenses: Not on file  Food  Insecurity:   . Worried About Programme researcher, broadcasting/film/video in the Last Year: Not on file  . Ran Out of Food in the Last Year: Not on file  Transportation Needs:   . Lack of Transportation (Medical): Not on file  . Lack of Transportation (Non-Medical): Not on file  Physical Activity:   . Days of Exercise per Week: Not on file  . Minutes of Exercise per Session: Not on file  Stress:   . Feeling of Stress : Not on file  Social Connections:   . Frequency of Communication with Friends and Family: Not on file  . Frequency of Social Gatherings with Friends and Family: Not on file  . Attends Religious Services: Not on file  . Active Member of Clubs or Organizations: Not on file  . Attends Banker Meetings: Not on file  . Marital Status: Not on file  Intimate Partner Violence:   . Fear of Current or Ex-Partner: Not on file  . Emotionally Abused: Not on file  . Physically Abused: Not on file  . Sexually Abused: Not on file     Physical Exam   Vitals:   08/17/20 1429  BP: 116/76  Pulse: 87  Resp: 16  Temp: 98.7 F (37.1 C)  SpO2: 100%    CONSTITUTIONAL: Well-appearing, NAD NEURO:  Alert and oriented x 3, no focal deficits EYES:  eyes equal and reactive ENT/NECK:  no LAD, no JVD CARDIO: Regular rate, well-perfused, normal S1 and S2 PULM:  CTAB no wheezing or rhonchi GI/GU:  normal bowel sounds, non-distended, non-tender MSK/SPINE:  No gross deformities, no edema SKIN:  no rash, atraumatic PSYCH:  Appropriate speech and behavior  *Additional and/or pertinent findings included in MDM below  Diagnostic and Interventional Summary    EKG Interpretation  Date/Time:    Ventricular Rate:    PR Interval:    QRS Duration:   QT Interval:    QTC Calculation:   R Axis:     Text Interpretation:        Labs Reviewed  I-STAT BETA HCG BLOOD, ED ([facility], [facility], [facility] ONLY)    No orders to display    Medications - No data to display   Procedures  /  Critical Care Procedures  ED  Course and Medical Decision Making  I have reviewed the triage vital signs, the nursing notes, and pertinent available records from the EMR.  Listed above are laboratory and imaging tests that I personally ordered, reviewed, and interpreted and then considered in my medical decision making (see below for details).  Patient is well-appearing, completely nontoxic, normal vital signs, abdomen is soft and nontender.  Her hCG is negative.  This largely excludes emergencies such as ectopic pregnancy.  She has not had a period in 2 months, suspect this heavier bleeding is related to accumulation and/or abnormal uterine bleeding.  Hemodynamically stable, no evidence of infection, appropriate for discharge with GYN follow-up.       Elmer Sow. Pilar Plate, MD [facility] Emergency Medicine [facility] [email or web address]  Final Clinical Impressions(s) / ED Diagnoses     ICD-10-CM   1. Abnormal uterine bleeding  N93.9     ED Discharge Orders    None       Discharge Instructions Discussed with and Provided to Patient:     Discharge Instructions     You were evaluated in the Emergency Department and after careful evaluation, we did not find any emergent condition requiring admission or further testing in the hospital.  Your exam/testing today is overall reassuring.  We recommend follow-up with a GYN doctor to discuss your abnormal uterine bleeding.  Please return to the Emergency Department if you experience any worsening of your condition.   Thank you for allowing [place] to be a part of your care.      Sabas Sous, MD 08/17/20 1734

## 2020-09-01 ENCOUNTER — Other Ambulatory Visit (HOSPITAL_COMMUNITY)
Admission: RE | Admit: 2020-09-01 | Discharge: 2020-09-01 | Disposition: A | Discharge status: Home / Self Care | Payer: Medicaid Other | Source: Ambulatory Visit

## 2020-09-01 ENCOUNTER — Other Ambulatory Visit: Payer: Self-pay

## 2020-09-01 ENCOUNTER — Ambulatory Visit (INDEPENDENT_AMBULATORY_CARE_PROVIDER_SITE_OTHER): Payer: Medicaid Other

## 2020-09-01 VITALS — BP 100/63 | HR 71 | Temp 97.7°F | Ht 65.0 in | Wt 266.4 lb

## 2020-09-01 DIAGNOSIS — R197 Diarrhea, unspecified: Secondary | ICD-10-CM

## 2020-09-01 DIAGNOSIS — Z113 Encounter for screening for infections with a predominantly sexual mode of transmission: Secondary | ICD-10-CM

## 2020-09-01 DIAGNOSIS — Z09 Encounter for follow-up examination after completed treatment for conditions other than malignant neoplasm: Secondary | ICD-10-CM

## 2020-09-01 NOTE — Patient Instructions (Signed)
Loperamide tablets or capsules What is this medicine? LOPERAMIDE (loe PER a mide) is used to treat diarrhea. This medicine may be used for other purposes; ask your health care provider or pharmacist if you have questions. COMMON BRAND NAME(S): Anti-Diarrheal, Imodium A-D, K-Pek II What should I tell my health care provider before I take this medicine? They need to know if you have any of these conditions:  a black or bloody stool  bacterial food poisoning  colitis or mucus in your stool  currently taking an antibiotic medication for an infection  fever  history of irregular heartbeat  liver disease  severe abdominal pain, swelling or bulging  an unusual or allergic reaction to loperamide, other medicines, foods, dyes, or preservatives  pregnant or trying to get pregnant  breast-feeding How should I use this medicine? Take this medicine by mouth with a glass of water. Follow the directions on the prescription label. Take your doses at regular intervals. Do not take your medicine more often than directed. Talk to your pediatrician regarding the use of this medicine in children. Special care may be needed. Overdosage: If you think you have taken too much of this medicine contact a poison control center or emergency room at once. NOTE: This medicine is only for you. Do not share this medicine with others. What if I miss a dose? This does not apply. This medicine is not for regular use. Only take this medicine while you continue to have loose bowel movements. Do not take more medicine than recommended by the packaging label or by your healthcare professional. What may interact with this medicine? Do not take this medicine with any of the following medications:   alosetron This medicine may also interact with the following  medications:  cimetidine  clarithromycin  erythromycin  gemfibrozil  itraconazole  ketoconazole  quinidine  quinine  ranitidine  ritonavir  saquinavir This list may not describe all possible interactions. Give your health care provider a list of all the medicines, herbs, non-prescription drugs, or dietary supplements you use. Also tell them if you smoke, drink alcohol, or use illegal drugs. Some items may interact with your medicine. What should I watch for while using this medicine? Do not take this medicine for more than 2 days without asking your doctor or health care professional. Do not use doses higher than those prescribed by your doctor or listed on the label. Check with your doctor or health care professional right away if you develop a fever, severe abdominal pain, swelling or bulging, or if you have have bloody/black diarrhea or stools. You may get drowsy or dizzy. Do not drive, use machinery, or do anything that needs mental alertness until you know how this medicine affects you. Do not stand or sit up quickly, especially if you are an older patient. This reduces the risk of dizzy or fainting spells. Alcohol can increase possible drowsiness and dizziness. Avoid alcoholic drinks. Your mouth may get dry. Chewing sugarless gum or sucking hard candy, and drinking plenty of water may help. Contact your doctor if the problem does not go away or is severe. Drinking plenty of water can also help prevent dehydration that can occur with diarrhea. Elderly patients may have a more variable response to the effects of this medicine, and are more susceptible to the effects of dehydration. What side effects may I notice from receiving this medicine? Side effects that you should report to your doctor or health care professional as soon as possible:   allergic  reactions like skin rash, itching or hives, swelling of the face, lips, or tongue  bloated, swollen feeling in your  abdomen  blurred vision  loss of appetite  signs and symptoms of a dangerous change in heartbeat or heart rhythm like chest pain; dizziness; fast or irregular heartbeat palpitations; feeling faint or lightheaded, falls; breathing problems  stomach pain Side effects that usually do not require medical attention (report to your doctor or health care professional if they continue or are bothersome):   constipation  drowsiness or dizziness  dry mouth  nausea, vomiting This list may not describe all possible side effects. Call your doctor for medical advice about side effects. You may report side effects to FDA at 1-800-FDA-1088. Where should I keep my medicine? Keep out of the reach of children. Store at room temperature between 15 and 25 degrees C (59 and 77 degrees F). Keep container tightly closed. Throw away any unused medicine after the expiration date. NOTE: This sheet is a summary. It may not cover all possible information. If you have questions about this medicine, talk to your doctor, pharmacist, or health care provider.  2020 Elsevier/Gold Standard (2017-12-08 11:30:48)

## 2020-09-01 NOTE — Progress Notes (Signed)
GYNECOLOGY PROBLEM OFFICE VISIT NOTE  History:  Ashley Mccarty is a 29 y.o.  here today for ER follow up.  Patient states she was seen on Nov 18th for miscarriage.  Patient reports her bleeding has since improved and she has no pain.  Patient reports that she had significant bleeding at the time of her ER visit and was told that she needs to follow up.  Patient denies any abnormal vaginal discharge, bleeding, pelvic pain or other concerns. She reports she had a dose of Depo Provera in Sept 2019 and has not had a menstrual cycle since.  Patient goes on to state that she was told she could not get pregnant since having the Depo Provera. Patient also questions why she did not get pregnant with her last partner despite no condom usage.   Past Medical History:  Diagnosis Date  . Anemia   . Asthma   . Diabetes mellitus without complication (HCC)   . Hypertension     Past Surgical History:  Procedure Laterality Date  . CESAREAN SECTION      The following portions of the patient's history were reviewed and updated as appropriate: allergies, current medications, past family history, past medical history, past social history, past surgical history and problem list.   Health Maintenance:  No pap on file.  No mammogram history.  Review of Systems:  Genito-Urinary ROS: no dysuria, trouble voiding, or hematuria Gastrointestinal ROS: positive for - diarrhea and "watery, oily, slimy black" stool Objective:  Vitals: BP 100/63 (BP Location: Left Arm, Patient Position: Sitting, Cuff Size: Normal)   Pulse 71   Temp 97.7 F (36.5 C) (Oral)   Ht 5\' 5"  (1.651 m)   Wt 266 lb 6.4 oz (120.8 kg)   LMP 08/14/2020 (Exact Date)   BMI 44.33 kg/m   Physical Exam: Physical Exam Constitutional:      General: She is not in acute distress.    Appearance: Normal appearance. She is obese. She is not ill-appearing.  HENT:     Head: Normocephalic and atraumatic.  Eyes:     Conjunctiva/sclera: Conjunctivae  normal.  Cardiovascular:     Rate and Rhythm: Normal rate and regular rhythm.     Heart sounds: Normal heart sounds.  Pulmonary:     Effort: Pulmonary effort is normal.     Breath sounds: Normal breath sounds.  Musculoskeletal:        General: Normal range of motion.     Cervical back: Normal range of motion.  Neurological:     Mental Status: She is alert and oriented to person, place, and time.  Skin:    General: Skin is warm and dry.  Psychiatric:        Mood and Affect: Mood normal.        Behavior: Behavior normal.        Thought Content: Thought content normal.      Labs and Imaging: No results found.  Assessment & Plan:  29 year old Follow up after ER Visit STD Screening Diarrhea  -Review of ER note shows Negative UPT and hCG <5.  Patient informed that findings were not indicative of pregnancy or need for follow up. -Informed that we will not need to collect documents from MD regarding repeat quant after ER visit.  -Patient further informed that one Depo Provera injection 2 years ago would not be cause for continued irregular menstruation.  -Discussed desire for pregnancy and instructed to start PNV.  Patient states she is taking  PNV with fish oil pill. -Informed that fish oil/DHA can cause diarrhea and "oily" stools.  Instructed to discontinue DHA usage.  Discussed usage of imodium for treatment of diarrhea complaint.  -Offered and accepts full STD testing. -RTO as needed or in 6 months for annual exam.    Total face-to-face time with patient: 25 minutes   Gerrit Heck, CNM 09/01/2020 10:25 AM

## 2020-09-02 LAB — HEPB+HEPC+HIV PANEL

## 2020-09-02 LAB — RPR

## 2020-09-04 LAB — HEPB+HEPC+HIV PANEL
HIV Screen 4th Generation wRfx: REACTIVE — AB
Hep B C IgM: NEGATIVE
Hep B Core Total Ab: NEGATIVE
Hep B E Ab: NEGATIVE
Hep B E Ag: NEGATIVE
Hep B Surface Ab, Qual: REACTIVE
Hep C Virus Ab: <0.1 s/co ratio (ref 0.0–0.9)
Hepatitis B Surface Ag: NEGATIVE

## 2020-09-04 LAB — HIV 1/2 AB DIFFERENTIATION
HIV 1 Ab: POSITIVE — AB
HIV 2 Ab: NEGATIVE
NOTE (HIV CONF MULTIP: POSITIVE — AB

## 2020-09-05 ENCOUNTER — Telehealth: Payer: Self-pay

## 2020-09-05 ENCOUNTER — Telehealth: Payer: Self-pay | Admitting: Infectious Disease

## 2020-09-05 DIAGNOSIS — Z21 Asymptomatic human immunodeficiency virus [HIV] infection status: Secondary | ICD-10-CM

## 2020-09-05 DIAGNOSIS — A530 Latent syphilis, unspecified as early or late: Secondary | ICD-10-CM

## 2020-09-05 LAB — URINE CYTOLOGY ANCILLARY ONLY
Chlamydia: NEGATIVE
Comment: NEGATIVE
Comment: NEGATIVE
Comment: NORMAL
Neisseria Gonorrhea: NEGATIVE
Trichomonas: POSITIVE — AB

## 2020-09-05 LAB — RPR, QUANT+TP ABS (REFLEX)
Rapid Plasma Reagin, Quant: 1:2 {titer} — ABNORMAL HIGH
T Pallidum Abs: REACTIVE — AB

## 2020-09-05 LAB — RPR: RPR Ser Ql: REACTIVE — AB

## 2020-09-05 NOTE — Telephone Encounter (Signed)
Ashley Mccarty  Looks as if you have brand newly diagnosed woman with HIV (tested negative a year ago)  Does the patient know she was being tested for HIV and does she now know of positive test?  We would be quite happy to get her pllugged into [facility] this week and start her on antiretrovirals that same day

## 2020-09-05 NOTE — Telephone Encounter (Signed)
Makenleigh Crownover [birth year] [id number]    Patient called and verified her identity via birth date and last 4 of her SSN.  Provider assesses patient current whereabouts and ability to receive results that will require support and time for processing.  Patient states she is not working, driving, and is around family members. Patient agreeable to results via phone and was informed of Positive HIV test.  Provider expresses support and apologizes for giving results over the phone.  Provider goes on to explain that she did not want patient to discover results via mychart without someone being able to address initial questions and/or concerns.  Patient appropriately upset and informed of next steps in process which includes referral to [facility] and [facility] for management of care.  Patient questions and concerns addressed.  Patient questions what her levels are and informed that provider not sure.  Patient also questions what type and informed that results show type 1. Patient remarks that she should not attempt pregnancy at this time and provider agrees. Encouraged to wait until management has been initiated and [facility] approves.  Patient also informed of positive RPR findings.  Provider offered support and comfort, while addressing additional questions and concerns.  Patient encouraged to contact office if any other questions or concerns arise.  Patient further informed of availability of [facility], but can seek emergent BH services through the ED if necessary.  Patient verbalizes understanding and without further q/c.    Cherre Robins MSN, CNM Advanced Practice Provider, Center for [facility]  **This visit was completed, in its entirety, via telehealth communications.  I personally spent >/=14 minutes on the phone providing recommendations, education, and guidance.**  *Referral placed with [facility] and secure chat initiated by Dr. Natividad Brood formulating POC.  Patient to be scheduled with Dr. Marcene Duos for initial appt and  start of antivirals.    *Call placed to Division of [place] [facility] and Zella Ball reports that they are aware of patient.

## 2020-09-06 ENCOUNTER — Telehealth: Payer: Self-pay

## 2020-09-06 DIAGNOSIS — A599 Trichomoniasis, unspecified: Secondary | ICD-10-CM

## 2020-09-06 DIAGNOSIS — B3731 Acute candidiasis of vulva and vagina: Secondary | ICD-10-CM

## 2020-09-06 DIAGNOSIS — B373 Candidiasis of vulva and vagina: Secondary | ICD-10-CM

## 2020-09-06 MED ORDER — FLUCONAZOLE 150 MG PO TABS
150.0000 mg | ORAL_TABLET | Freq: Every day | ORAL | 1 refills | Status: DC
Start: 2020-09-06 — End: 2020-09-08

## 2020-09-06 MED ORDER — METRONIDAZOLE 500 MG PO TABS: 2000.0000 mg | ORAL_TABLET | Freq: Once | ORAL | 0 refills | Status: AC

## 2020-09-06 NOTE — Telephone Encounter (Addendum)
Ashley Mccarty 06-Oct-1990 [id number]  Patient called and verified her identity via birth date and last 4 of her SSN.  Patient reports she is doing well considering recent diagnosis. Patient informed that [facility] has been contacted and is actively making plan to contact patient to initiate treatment. Provider offers referral to [facility] and declines stating she has SW services and resources in place.     Patient informed of additional results and agreeable to receiving results via phone; informed of trichomoniasis and need for treatment.  Discussed treatment and advised taking ~30-60 minutes after a meal to avoid upset stomach.  Patient reports she has had decreased appetite since yesterday.  Provider expresses understanding and offers support.  Encouraged to attempt to take and if unable to keep down to contact office for refill of prescription.  Patient verbalizes understanding and reports vaginal discharge that she describes as "curdy." She denies itching.  Informed that likely yeast infection and provider to send treatment to pharmacy on file.  Instructed to take dosage after Flagyl dosing and repeat in 72 hours if necessary.   Patient encouraged to call if questions, concerns, or need for additional support arises.  Patient verbalizes understanding.  Cherre Robins MSN, CNM Advanced Practice Provider, Center for [facility]  **This visit was completed, in its entirety, via telehealth communications.  I personally spent >/=6 minutes on the phone providing recommendations, education, and guidance.**

## 2020-09-07 NOTE — Telephone Encounter (Signed)
Patient has been scheduled with Dr Renold Don for 12/10, rapid start. Andree Coss, RN

## 2020-09-08 ENCOUNTER — Encounter: Payer: Self-pay | Admitting: Internal Medicine

## 2020-09-08 ENCOUNTER — Other Ambulatory Visit: Payer: Self-pay

## 2020-09-08 ENCOUNTER — Ambulatory Visit (INDEPENDENT_AMBULATORY_CARE_PROVIDER_SITE_OTHER): Payer: Medicaid Other | Admitting: Internal Medicine

## 2020-09-08 VITALS — BP 107/73 | HR 78 | Temp 98.6°F | Resp 16 | Ht 65.0 in | Wt 266.6 lb

## 2020-09-08 DIAGNOSIS — E119 Type 2 diabetes mellitus without complications: Secondary | ICD-10-CM

## 2020-09-08 DIAGNOSIS — A599 Trichomoniasis, unspecified: Secondary | ICD-10-CM

## 2020-09-08 DIAGNOSIS — A539 Syphilis, unspecified: Secondary | ICD-10-CM

## 2020-09-08 DIAGNOSIS — B20 Human immunodeficiency virus [HIV] disease: Secondary | ICD-10-CM | POA: Diagnosis present

## 2020-09-08 DIAGNOSIS — Z23 Encounter for immunization: Secondary | ICD-10-CM

## 2020-09-08 LAB — T-HELPER CELLS (CD4) COUNT (NOT AT ARMC)
CD4 % Helper T Cell: 51 % (ref 33–65)
CD4 T Cell Abs: 1890 /uL — ABNORMAL HIGH (ref 400–1790)

## 2020-09-08 MED ORDER — BICTEGRAVIR-EMTRICITAB-TENOFOV 50-200-25 MG PO TABS: 1.0000 | ORAL_TABLET | Freq: Every day | ORAL | 2 refills | Status: AC

## 2020-09-08 MED ORDER — PENICILLIN G BENZATHINE 1200000 UNIT/2ML IM SUSP
2.4000 10*6.[IU] | Freq: Once | INTRAMUSCULAR | Status: AC
  Administered 2020-09-08: 2.4 10*6.[IU] via INTRAMUSCULAR

## 2020-09-08 MED ORDER — METRONIDAZOLE 500 MG PO TABS: 500.0000 mg | ORAL_TABLET | Freq: Two times a day (BID) | ORAL | 0 refills | Status: AC

## 2020-09-08 NOTE — Patient Instructions (Addendum)
Continue biktarvy for your hiv  We'll see you in 1 month to follow up on your hiv treatment progress. You should be well controlled  In about 6-8 weeks  We'll also need to treat your trichomonas for 7 days with metronidazole, and for the syphilis 1 shot of penicillin intramuscular injection  You can resume your metformin for diabetes    Will discuss more as question comes along in your mind   Immunization: Flu shot to be given today
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
## 2020-09-08 NOTE — Addendum Note (Signed)
Addended by: Clayborne Artist A on: 09/08/2020 10:58 AM   Modules accepted: Orders

## 2020-09-15 LAB — QUANTIFERON-TB GOLD PLUS
Mitogen-NIL: >10 IU/mL
NIL: 0.03 IU/mL
QuantiFERON-TB Gold Plus: NEGATIVE
TB1-NIL: 0 IU/mL
TB2-NIL: 0 IU/mL

## 2020-09-15 LAB — LIPID PANEL
Cholesterol: 145 mg/dL (ref <200)
HDL: 52 mg/dL (ref >=50)
LDL Cholesterol (Calc): 79 mg/dL (calc)
Non-HDL Cholesterol (Calc): 93 mg/dL (calc) (ref <130)
Total CHOL/HDL Ratio: 2.8 (calc) (ref <5.0)
Triglycerides: 59 mg/dL (ref <150)

## 2020-09-15 LAB — HLA B*5701: HLA-B*5701 w/rflx HLA-B High: NEGATIVE

## 2020-09-15 LAB — HEMOGLOBIN A1C
Hgb A1c MFr Bld: 6.1 % of total Hgb — ABNORMAL HIGH (ref <5.7)
Mean Plasma Glucose: 128 mg/dL
eAG (mmol/L): 7.1 mmol/L

## 2020-09-15 LAB — HIV-1 RNA ULTRAQUANT REFLEX TO GENTYP+
HIV 1 RNA Quant: 32 copies/mL — ABNORMAL HIGH
HIV-1 RNA Quant, Log: 1.51 Log copies/mL — ABNORMAL HIGH

## 2020-10-13 ENCOUNTER — Ambulatory Visit: Payer: Medicaid Other | Admitting: Internal Medicine

## 2020-12-21 ENCOUNTER — Ambulatory Visit (HOSPITAL_COMMUNITY)
Admission: RE | Admit: 2020-12-21 | Discharge: 2020-12-21 | Disposition: A | Discharge status: Home / Self Care | Payer: Medicaid Other | Source: Ambulatory Visit | Attending: Urgent Care | Admitting: Urgent Care

## 2020-12-21 ENCOUNTER — Other Ambulatory Visit: Payer: Self-pay

## 2020-12-21 ENCOUNTER — Encounter (HOSPITAL_COMMUNITY): Payer: Self-pay

## 2020-12-21 VITALS — BP 138/80 | HR 77 | Temp 97.7°F | Resp 22

## 2020-12-21 DIAGNOSIS — Z91040 Latex allergy status: Secondary | ICD-10-CM | POA: Insufficient documentation

## 2020-12-21 DIAGNOSIS — Z79899 Other long term (current) drug therapy: Secondary | ICD-10-CM | POA: Diagnosis not present

## 2020-12-21 DIAGNOSIS — Z888 Allergy status to other drugs, medicaments and biological substances status: Secondary | ICD-10-CM | POA: Insufficient documentation

## 2020-12-21 DIAGNOSIS — Z87891 Personal history of nicotine dependence: Secondary | ICD-10-CM | POA: Insufficient documentation

## 2020-12-21 DIAGNOSIS — B20 Human immunodeficiency virus [HIV] disease: Secondary | ICD-10-CM

## 2020-12-21 DIAGNOSIS — Z7984 Long term (current) use of oral hypoglycemic drugs: Secondary | ICD-10-CM | POA: Diagnosis not present

## 2020-12-21 DIAGNOSIS — R051 Acute cough: Secondary | ICD-10-CM | POA: Diagnosis present

## 2020-12-21 DIAGNOSIS — J4530 Mild persistent asthma, uncomplicated: Secondary | ICD-10-CM

## 2020-12-21 DIAGNOSIS — Z20822 Contact with and (suspected) exposure to covid-19: Secondary | ICD-10-CM | POA: Insufficient documentation

## 2020-12-21 MED ORDER — CETIRIZINE HCL 10 MG PO TABS: 10.0000 mg | ORAL_TABLET | Freq: Every day | ORAL | 0 refills | Status: AC

## 2020-12-21 MED ORDER — ALBUTEROL SULFATE HFA 108 (90 BASE) MCG/ACT IN AERS
INHALATION_SPRAY | RESPIRATORY_TRACT | Status: AC
  Filled 2020-12-21: qty 6.7

## 2020-12-21 MED ORDER — PREDNISONE 20 MG PO TABS: ORAL_TABLET | ORAL | 0 refills | Status: AC

## 2020-12-21 NOTE — ED Provider Notes (Signed)
[facility]   MRN: [id number] DOB: 09-10-1991  Subjective:   Ashley Mccarty is a 30 y.o. female presenting for 3-day history of acute onset shortness of breath, wheezing, persistent hacking cough.  Patient has a history of asthma, has not needed an inhaler for 4 to 5 years.  Denies fever, body aches.  She has not COVID vaccinated.  States that she is social distance is very well, no sick exposures recently.  Her entire family has gotten COVID but not recently.  She was previously smoking cigarettes but quit, still smokes marijuana regularly.  Has a history of HIV disease.  No current facility-administered medications for this encounter.  Current Outpatient Medications:  .  acetaminophen (TYLENOL) 500 MG tablet, Take 500 mg by mouth every 6 (six) hours as needed., Disp: , Rfl:  .  bictegravir-emtricitabine-tenofovir AF (BIKTARVY) 50-200-25 MG TABS tablet, Take 1 tablet by mouth daily., Disp: 30 tablet, Rfl: 2 .  bismuth subsalicylate (PEPTO BISMOL) 262 MG/15ML suspension, Take 30 mLs by mouth every 6 (six) hours as needed., Disp: , Rfl:  .  escitalopram (LEXAPRO) 10 MG tablet, Take 10 mg by mouth daily., Disp: , Rfl:  .  gabapentin (NEURONTIN) 300 MG capsule, Take by mouth., Disp: , Rfl:  .  hydrochlorothiazide (HYDRODIURIL) 25 MG tablet, Take 25 mg by mouth daily., Disp: , Rfl:  .  metFORMIN (GLUCOPHAGE) 500 MG tablet, Take 1,000 mg by mouth 2 (two) times daily. (Patient not taking: Reported on 09/08/2020), Disp: , Rfl:  .  omeprazole (PRILOSEC) 40 MG capsule, Take 40 mg by mouth daily. (Patient not taking: Reported on 09/08/2020), Disp: , Rfl:  .  ondansetron (ZOFRAN) 4 MG tablet, Take 1 tablet (4 mg total) by mouth every 6 (six) hours., Disp: 12 tablet, Rfl: 0   Allergies  Allergen Reactions  . Latex Shortness Of Breath  . Toradol [Ketorolac Tromethamine] Shortness Of Breath    Past Medical History:  Diagnosis Date  . Anemia   . Asthma   . Diabetes mellitus  without complication (HCC)   . Hypertension      Past Surgical History:  Procedure Laterality Date  . CESAREAN SECTION      Family History  Problem Relation Age of Onset  . Diabetes Mother   . Hyperlipidemia Mother   . Hypertension Mother     Social History   Tobacco Use  . Smoking status: Never Smoker  . Smokeless tobacco: Never Used  Substance Use Topics  . Alcohol use: Yes  . Drug use: Never    ROS   Objective:   Vitals: BP 138/80 (BP Location: Right Arm)   Pulse 77   Temp 97.7 F (36.5 C) (Oral)   Resp (!) 22   LMP 12/05/2020   SpO2 100%   Physical Exam Constitutional:      General: She is not in acute distress.    Appearance: Normal appearance. She is well-developed. She is obese. She is not ill-appearing, toxic-appearing or diaphoretic.  HENT:     Head: Normocephalic and atraumatic.     Nose: Nose normal.     Mouth/Throat:     Mouth: Mucous membranes are moist.  Eyes:     Extraocular Movements: Extraocular movements intact.     Pupils: Pupils are equal, round, and reactive to light.  Cardiovascular:     Rate and Rhythm: Normal rate and regular rhythm.     Pulses: Normal pulses.     Heart sounds: Normal heart sounds. No murmur  heard. No friction rub. No gallop.   Pulmonary:     Effort: Pulmonary effort is normal. No respiratory distress.     Breath sounds: No stridor. Wheezing (throughout but cleared after 2 puffs of albuterol) present. No rhonchi or rales.  Skin:    General: Skin is warm and dry.     Findings: No rash.  Neurological:     Mental Status: She is alert and oriented to person, place, and time.  Psychiatric:        Mood and Affect: Mood normal.        Behavior: Behavior normal.        Thought Content: Thought content normal.       Assessment and Plan :   PDMP not reviewed this encounter.  1. Mild persistent asthma without complication   2. HIV disease (HCC)     Patient had substantial improvement in her breathing,  wheezing and lung sounds status post 2 puffs of an albuterol inhaler in our clinic.  COVID-19 testing is pending.  We will manage her for an asthma flare with an oral prednisone course.  Recommended continued efforts at quitting smoking.  Use supportive care otherwise. Counseled patient on potential for adverse effects with medications prescribed/recommended today, ER and return-to-clinic precautions discussed, patient verbalized understanding.    Wallis Bamberg, [place] 12/21/20 1930

## 2020-12-21 NOTE — ED Triage Notes (Signed)
P)t presents with shortness of breath, wheezing, and non productive cough X 3 days; Pt has Hx of asthma but has not had to use inhaler in 4 years

## 2020-12-22 LAB — SARS CORONAVIRUS 2 (TAT 6-24 HRS): SARS Coronavirus 2: NEGATIVE

## 2020-12-26 ENCOUNTER — Encounter: Payer: Self-pay | Admitting: Internal Medicine

## 2020-12-26 ENCOUNTER — Other Ambulatory Visit: Payer: Self-pay

## 2020-12-26 ENCOUNTER — Other Ambulatory Visit (HOSPITAL_COMMUNITY)
Admission: RE | Admit: 2020-12-26 | Discharge: 2020-12-26 | Disposition: A | Discharge status: Home / Self Care | Payer: Medicaid Other | Source: Ambulatory Visit | Attending: Internal Medicine | Admitting: Internal Medicine

## 2020-12-26 ENCOUNTER — Ambulatory Visit: Payer: Medicaid Other | Admitting: Internal Medicine

## 2020-12-26 VITALS — BP 145/92 | HR 80 | Temp 98.1°F | Wt 274.0 lb

## 2020-12-26 DIAGNOSIS — B20 Human immunodeficiency virus [HIV] disease: Secondary | ICD-10-CM

## 2020-12-26 DIAGNOSIS — A539 Syphilis, unspecified: Secondary | ICD-10-CM

## 2020-12-26 DIAGNOSIS — E6609 Other obesity due to excess calories: Secondary | ICD-10-CM

## 2020-12-26 NOTE — Patient Instructions (Signed)
Repeat labs today Follow up with me end of 01/2021  Other labs checked today: Thyroid, syphilis, gonorrhea/chlamydia  Referral to endocrinology for weight management   Will discuss about satellite clinic option next visit
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
## 2020-12-27 LAB — URINE CYTOLOGY ANCILLARY ONLY
Chlamydia: NEGATIVE
Comment: NEGATIVE
Comment: NEGATIVE
Comment: NORMAL
Neisseria Gonorrhea: NEGATIVE
Trichomonas: POSITIVE — AB

## 2020-12-28 LAB — CBC
HCT: 37.1 % (ref 35.0–45.0)
Hemoglobin: 11.6 g/dL — ABNORMAL LOW (ref 11.7–15.5)
MCH: 23.1 pg — ABNORMAL LOW (ref 27.0–33.0)
MCHC: 31.3 g/dL — ABNORMAL LOW (ref 32.0–36.0)
MCV: 73.9 fL — ABNORMAL LOW (ref 80.0–100.0)
MPV: 10.7 fL (ref 7.5–12.5)
Platelets: 340 10*3/uL (ref 140–400)
RBC: 5.02 10*6/uL (ref 3.80–5.10)
RDW: 14.5 % (ref 11.0–15.0)
WBC: 13.2 10*3/uL — ABNORMAL HIGH (ref 3.8–10.8)

## 2020-12-28 LAB — COMPLETE METABOLIC PANEL WITH GFR
AG Ratio: 1.3 (calc) (ref 1.0–2.5)
ALT: 13 U/L (ref 6–29)
AST: 11 U/L (ref 10–30)
Albumin: 3.9 g/dL (ref 3.6–5.1)
Alkaline phosphatase (APISO): 72 U/L (ref 31–125)
BUN: 11 mg/dL (ref 7–25)
CO2: 26 mmol/L (ref 20–32)
Calcium: 9.3 mg/dL (ref 8.6–10.2)
Chloride: 107 mmol/L (ref 98–110)
Creat: 0.8 mg/dL (ref 0.50–1.10)
GFR, Est African American: 115 mL/min/{1.73_m2} (ref >=60)
GFR, Est Non African American: 99 mL/min/{1.73_m2} (ref >=60)
Globulin: 3.1 g/dL (calc) (ref 1.9–3.7)
Glucose, Bld: 97 mg/dL (ref 65–99)
Potassium: 3.9 mmol/L (ref 3.5–5.3)
Sodium: 140 mmol/L (ref 135–146)
Total Bilirubin: 0.2 mg/dL (ref 0.2–1.2)
Total Protein: 7 g/dL (ref 6.1–8.1)

## 2020-12-28 LAB — HEPATITIS B SURFACE ANTIGEN: Hepatitis B Surface Ag: NONREACTIVE

## 2020-12-28 LAB — RPR: RPR Ser Ql: NONREACTIVE

## 2020-12-28 LAB — TSH: TSH: 2.3 mIU/L

## 2020-12-28 LAB — HIV-1 RNA QUANT-NO REFLEX-BLD
HIV 1 RNA Quant: NOT DETECTED Copies/mL
HIV-1 RNA Quant, Log: NOT DETECTED Log cps/mL

## 2020-12-28 LAB — HEPATITIS B CORE ANTIBODY, TOTAL: Hep B Core Total Ab: NONREACTIVE

## 2020-12-28 LAB — HEPATITIS B SURFACE ANTIBODY, QUANTITATIVE: Hep B S AB Quant (Post): 101 m[IU]/mL (ref >=10)

## 2020-12-28 LAB — HEPATITIS C ANTIBODY
Hepatitis C Ab: NONREACTIVE
SIGNAL TO CUT-OFF: 0.02 (ref <1.00)

## 2020-12-29 ENCOUNTER — Telehealth: Payer: Self-pay | Admitting: Internal Medicine

## 2020-12-29 DIAGNOSIS — A599 Trichomoniasis, unspecified: Secondary | ICD-10-CM

## 2020-12-29 MED ORDER — METRONIDAZOLE 500 MG PO TABS: 500.0000 mg | ORAL_TABLET | Freq: Two times a day (BID) | ORAL | 0 refills | Status: AC

## 2020-12-29 NOTE — Telephone Encounter (Signed)
rx for trichomonas infection

## 2021-01-01 NOTE — Telephone Encounter (Signed)
Confirmed patient picked up medications. No further questions at this time.   Frederik Standley Loyola Mast, RN

## 2021-01-03 ENCOUNTER — Telehealth: Payer: Self-pay

## 2021-01-03 MED ORDER — ONDANSETRON HCL 4 MG PO TABS: 4.0000 mg | ORAL_TABLET | Freq: Three times a day (TID) | ORAL | 1 refills | Status: AC | PRN

## 2021-01-03 NOTE — Telephone Encounter (Signed)
-----   Message from Bobette Mo, CPhT sent at 01/03/2021 10:54 AM EDT ----- Regarding: Flagyl Hey Ashley Mccarty,  This patient called about the medication (flagyl) she received yesterday, she said she it is messing up her stomach and wanted someone to give her a call.      Thank You,  Clearance Coots, CPhT [facility] Phone: [phone] Fax: [phone]

## 2021-01-03 NOTE — Telephone Encounter (Signed)
Called patient to inform her that Dr Renold Don prescribed zofran for remaining 3 doses of flagyl. Instructed to continue to eat prior to taking medication and that she can try ginger ale. Instructed her to continue to follow up with [place] if she has no relief.   Ivis Henneman Loyola Mast, RN

## 2021-01-03 NOTE — Telephone Encounter (Signed)
Patient reports severe nausea and diarrhea with flagyl. Has taken 4 days but the last 2 days she reports increase in severity of symptoms. Has waited at least 1 hour between eating and taking medication without relief.   Forwarding to provider for assistance.  Hani Campusano Loyola Mast, RN

## 2021-01-03 NOTE — Addendum Note (Signed)
Addended by: Rutha Bouchard T on: 01/03/2021 11:30 AM   Modules accepted: Orders

## 2021-01-04 DIAGNOSIS — B20 Human immunodeficiency virus [HIV] disease: Secondary | ICD-10-CM | POA: Insufficient documentation

## 2021-03-04 IMAGING — DX DG ABDOMEN 2V
3 series · 3 of 3 positions shown · non-contrast
Comparison: CT 09/02/2019.

CLINICAL DATA: Abdominal pain.  Constipation.

EXAM:
ABDOMEN - 2 VIEW

[abdomen erect]
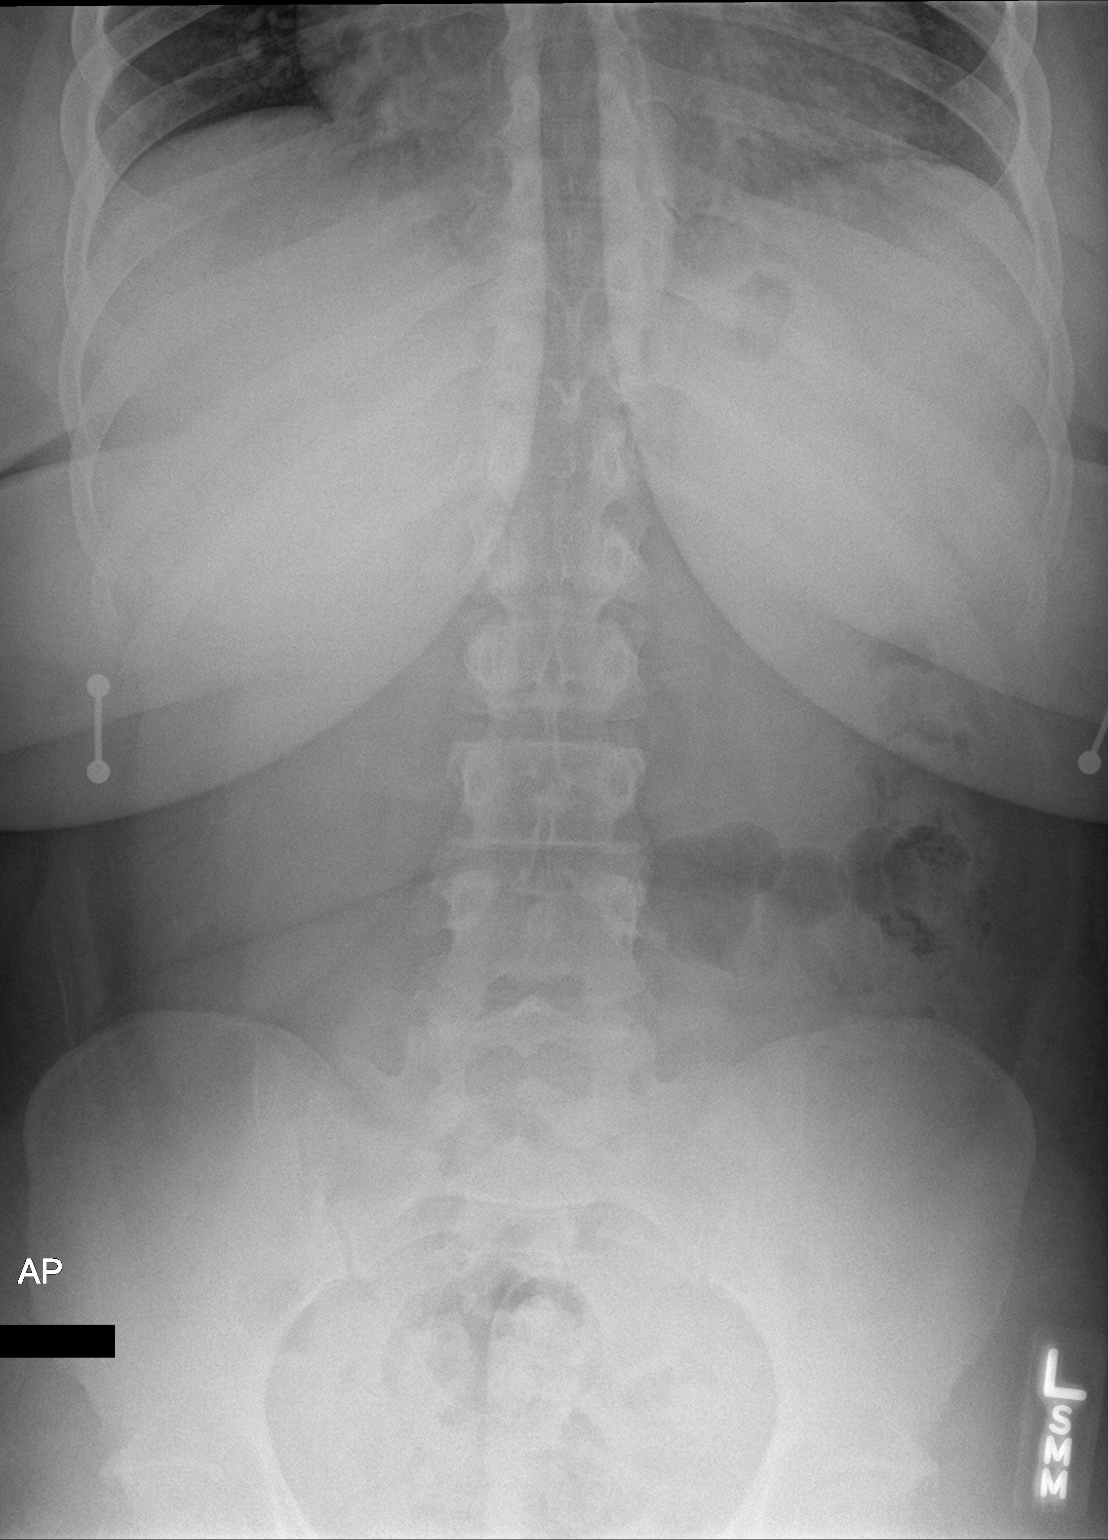

[abdomen supine (1 of 2)]
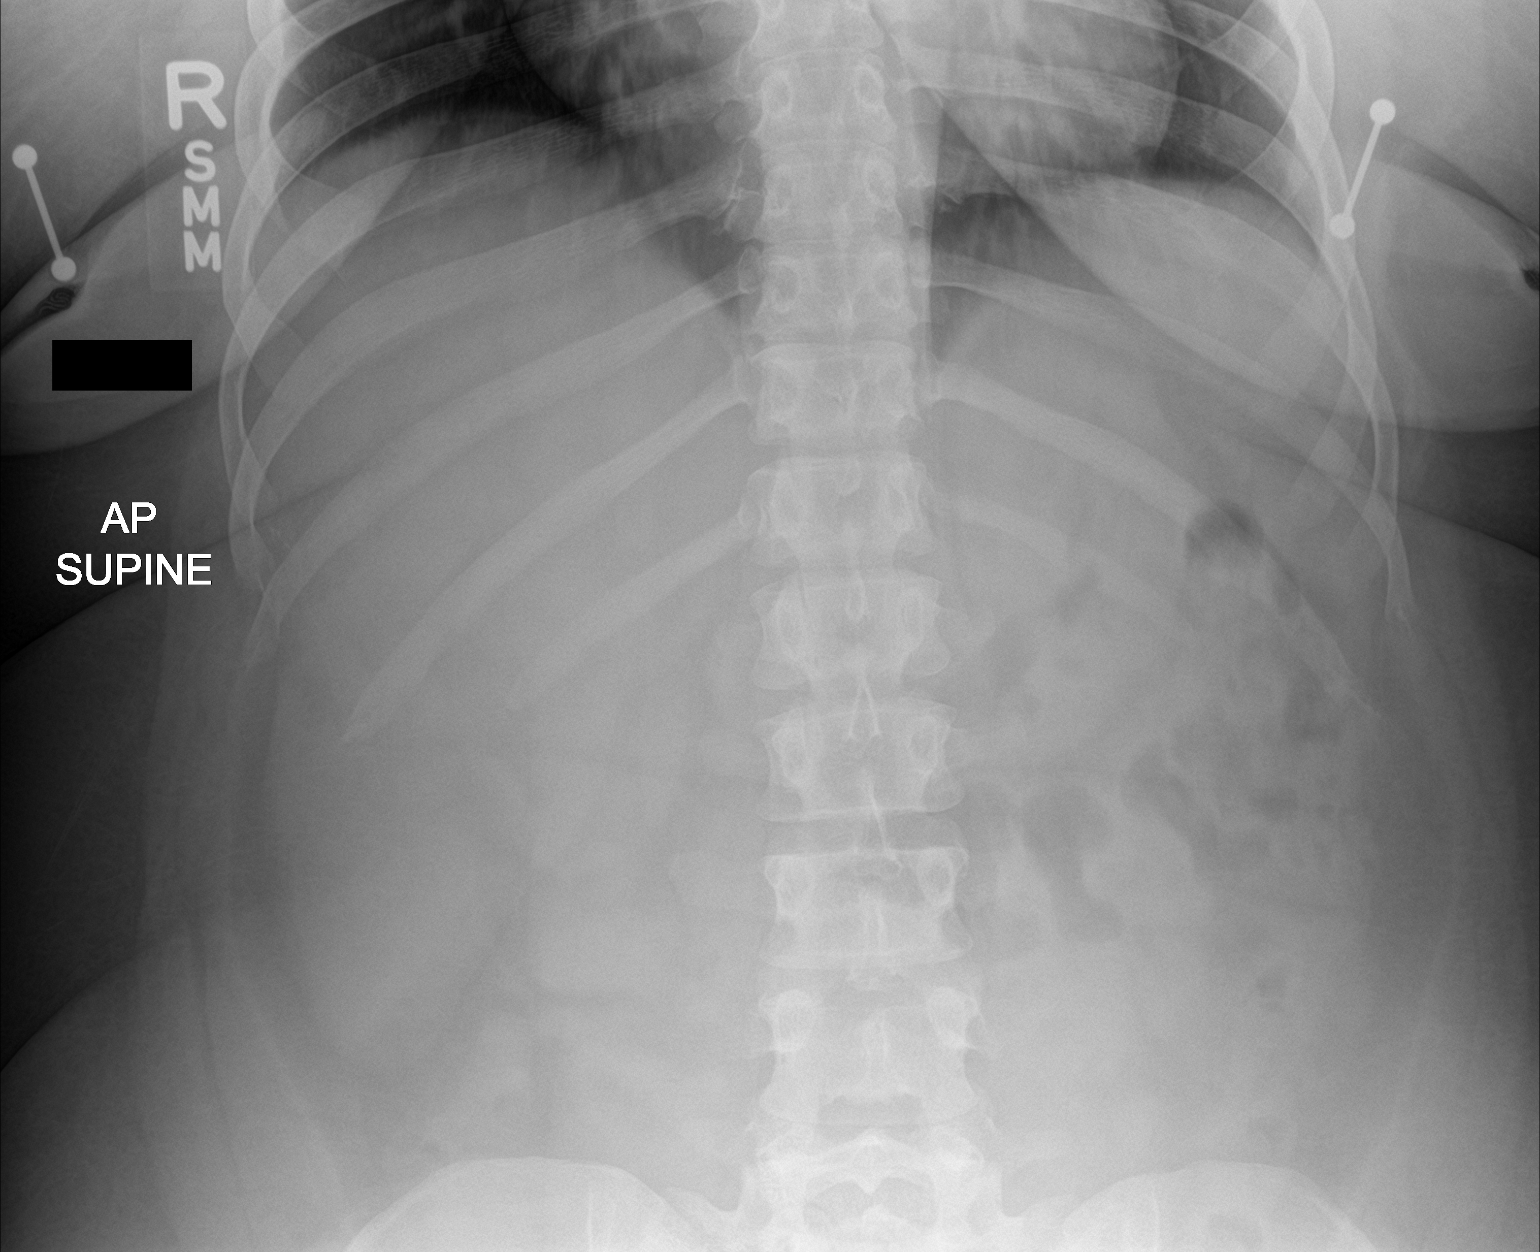

[abdomen supine (2 of 2)]
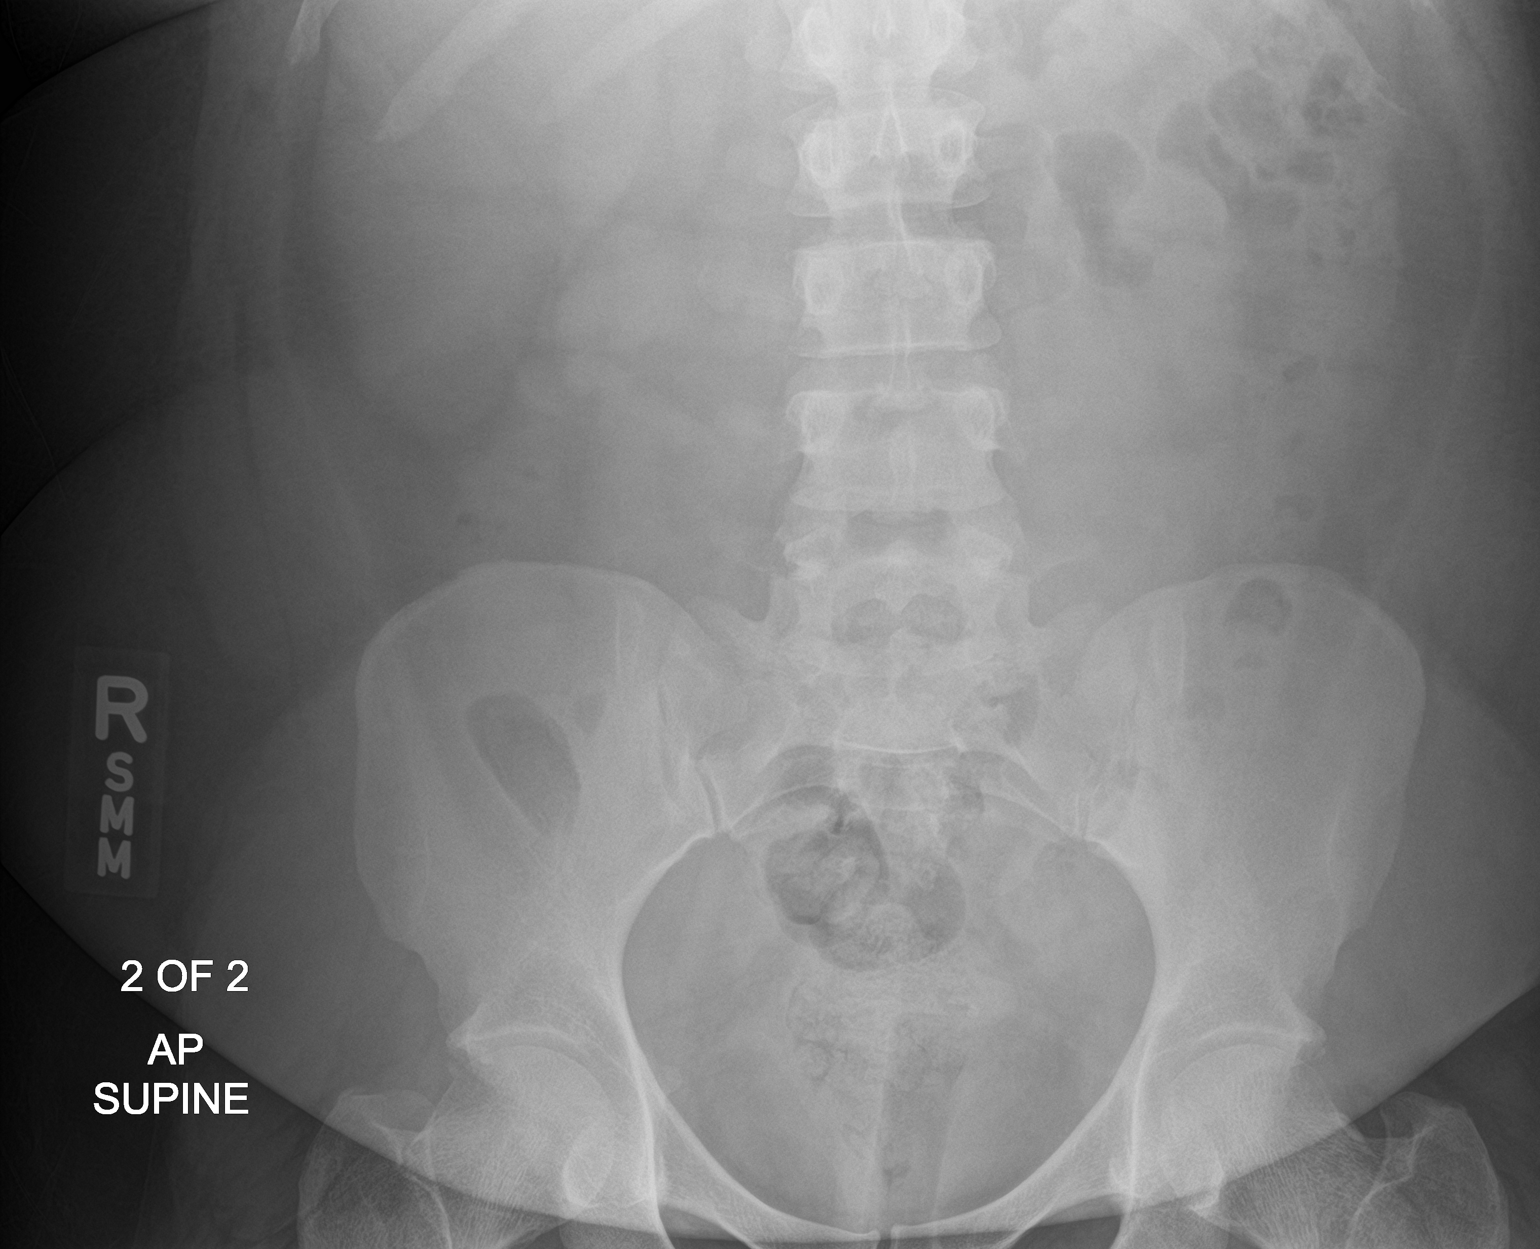

[3 of 3 positions shown; findings below may reference images not displayed]

FINDINGS: Soft tissue structures are unremarkable. No bowel distention.
Moderate stool noted throughout the colon. No free air. Mild lumbar
spine scoliosis concave right. No acute bony abnormality.
IMPRESSION: No acute abnormality identified.  Moderate stool volume.

## 2021-03-05 ENCOUNTER — Ambulatory Visit: Payer: Medicaid Other | Admitting: Internal Medicine

## 2021-04-18 ENCOUNTER — Ambulatory Visit: Payer: Medicaid Other | Admitting: Internal Medicine
